# Patient Record
Sex: Female | Born: 1940 | Race: White | Hispanic: No | Marital: Married | Smoking: Never smoker
Health system: Southern US, Community
[De-identification: ages and names within clinical notes are randomized; demographics above are authoritative.]

## PROBLEM LIST (undated history)

## (undated) DIAGNOSIS — E079 Disorder of thyroid, unspecified: Secondary | ICD-10-CM

## (undated) DIAGNOSIS — I10 Essential (primary) hypertension: Secondary | ICD-10-CM

## (undated) HISTORY — PX: REPLACEMENT TOTAL KNEE: SUR1224

## (undated) HISTORY — PX: TOTAL SHOULDER REPLACEMENT: SUR1217

---

## 2017-06-08 ENCOUNTER — Inpatient Hospital Stay (HOSPITAL_BASED_OUTPATIENT_CLINIC_OR_DEPARTMENT_OTHER)
Admission: EM | Admit: 2017-06-08 | Discharge: 2017-06-11 | DRG: 481 | Disposition: A | Discharge status: Home Health | Payer: Medicare Other | Attending: Internal Medicine | Admitting: Internal Medicine

## 2017-06-08 ENCOUNTER — Emergency Department (HOSPITAL_BASED_OUTPATIENT_CLINIC_OR_DEPARTMENT_OTHER): Payer: Medicare Other

## 2017-06-08 ENCOUNTER — Encounter (HOSPITAL_BASED_OUTPATIENT_CLINIC_OR_DEPARTMENT_OTHER): Payer: Self-pay | Admitting: *Deleted

## 2017-06-08 DIAGNOSIS — Z880 Allergy status to penicillin: Secondary | ICD-10-CM

## 2017-06-08 DIAGNOSIS — Y9301 Activity, walking, marching and hiking: Secondary | ICD-10-CM | POA: Diagnosis present

## 2017-06-08 DIAGNOSIS — E039 Hypothyroidism, unspecified: Secondary | ICD-10-CM | POA: Diagnosis present

## 2017-06-08 DIAGNOSIS — S72331A Displaced oblique fracture of shaft of right femur, initial encounter for closed fracture: Secondary | ICD-10-CM | POA: Diagnosis not present

## 2017-06-08 DIAGNOSIS — M9711XA Periprosthetic fracture around internal prosthetic right knee joint, initial encounter: Secondary | ICD-10-CM | POA: Diagnosis present

## 2017-06-08 DIAGNOSIS — E785 Hyperlipidemia, unspecified: Secondary | ICD-10-CM | POA: Diagnosis present

## 2017-06-08 DIAGNOSIS — Z09 Encounter for follow-up examination after completed treatment for conditions other than malignant neoplasm: Secondary | ICD-10-CM

## 2017-06-08 DIAGNOSIS — S72301A Unspecified fracture of shaft of right femur, initial encounter for closed fracture: Secondary | ICD-10-CM | POA: Diagnosis present

## 2017-06-08 DIAGNOSIS — N390 Urinary tract infection, site not specified: Secondary | ICD-10-CM | POA: Diagnosis present

## 2017-06-08 DIAGNOSIS — M25551 Pain in right hip: Secondary | ICD-10-CM | POA: Diagnosis present

## 2017-06-08 DIAGNOSIS — S72441A Displaced fracture of lower epiphysis (separation) of right femur, initial encounter for closed fracture: Secondary | ICD-10-CM

## 2017-06-08 DIAGNOSIS — W19XXXA Unspecified fall, initial encounter: Secondary | ICD-10-CM

## 2017-06-08 DIAGNOSIS — I10 Essential (primary) hypertension: Secondary | ICD-10-CM | POA: Diagnosis present

## 2017-06-08 DIAGNOSIS — Z96612 Presence of left artificial shoulder joint: Secondary | ICD-10-CM | POA: Diagnosis present

## 2017-06-08 DIAGNOSIS — W109XXA Fall (on) (from) unspecified stairs and steps, initial encounter: Secondary | ICD-10-CM | POA: Diagnosis present

## 2017-06-08 DIAGNOSIS — S72409A Unspecified fracture of lower end of unspecified femur, initial encounter for closed fracture: Secondary | ICD-10-CM | POA: Diagnosis present

## 2017-06-08 HISTORY — DX: Essential (primary) hypertension: I10

## 2017-06-08 HISTORY — DX: Disorder of thyroid, unspecified: E07.9

## 2017-06-08 LAB — CBC WITH DIFFERENTIAL/PLATELET
BASOS ABS: 0 10*3/uL (ref 0.0–0.1)
BASOS PCT: 0 %
EOS PCT: 2 %
Eosinophils Absolute: 0.2 10*3/uL (ref 0.0–0.7)
HCT: 39.1 % (ref 36.0–46.0)
Hemoglobin: 12.7 g/dL (ref 12.0–15.0)
LYMPHS ABS: 1.2 10*3/uL (ref 0.7–4.0)
Lymphocytes Relative: 17 %
MCH: 30 pg (ref 26.0–34.0)
MCHC: 32.5 g/dL (ref 30.0–36.0)
MCV: 92.2 fL (ref 78.0–100.0)
MONO ABS: 0.9 10*3/uL (ref 0.1–1.0)
MONOS PCT: 12 %
Neutro Abs: 5 10*3/uL (ref 1.7–7.7)
Neutrophils Relative %: 69 %
PLATELETS: 213 10*3/uL (ref 150–400)
RBC: 4.24 MIL/uL (ref 3.87–5.11)
RDW: 14 % (ref 11.5–15.5)
WBC: 7.3 10*3/uL (ref 4.0–10.5)

## 2017-06-08 LAB — URINALYSIS, MICROSCOPIC (REFLEX)

## 2017-06-08 LAB — BASIC METABOLIC PANEL
Anion gap: 9 (ref 5–15)
BUN: 21 mg/dL — AB (ref 6–20)
CHLORIDE: 105 mmol/L (ref 101–111)
CO2: 26 mmol/L (ref 22–32)
CREATININE: 0.91 mg/dL (ref 0.44–1.00)
Calcium: 9 mg/dL (ref 8.9–10.3)
GFR calc Af Amer: >60 mL/min (ref >60)
GFR calc non Af Amer: 59 mL/min — ABNORMAL LOW (ref >60)
Glucose, Bld: 137 mg/dL — ABNORMAL HIGH (ref 65–99)
Potassium: 4.4 mmol/L (ref 3.5–5.1)
SODIUM: 140 mmol/L (ref 135–145)

## 2017-06-08 LAB — URINALYSIS, ROUTINE W REFLEX MICROSCOPIC
BILIRUBIN URINE: NEGATIVE
Glucose, UA: NEGATIVE mg/dL
HGB URINE DIPSTICK: NEGATIVE
KETONES UR: NEGATIVE mg/dL
NITRITE: NEGATIVE
PROTEIN: NEGATIVE mg/dL
Specific Gravity, Urine: 1.01 (ref 1.005–1.030)
pH: 7.5 (ref 5.0–8.0)

## 2017-06-08 LAB — PROTIME-INR
INR: 0.89
PROTHROMBIN TIME: 12 s (ref 11.4–15.2)

## 2017-06-08 MED ORDER — ONDANSETRON HCL 4 MG/2ML IJ SOLN
4.0000 mg | Freq: Once | INTRAMUSCULAR | Status: AC
  Administered 2017-06-08: 4 mg via INTRAVENOUS
  Filled 2017-06-08: qty 2

## 2017-06-08 MED ORDER — MORPHINE SULFATE (PF) 4 MG/ML IV SOLN
4.0000 mg | Freq: Once | INTRAVENOUS | Status: AC
  Administered 2017-06-08: 4 mg via INTRAVENOUS
  Filled 2017-06-08: qty 1

## 2017-06-08 NOTE — ED Provider Notes (Signed)
Emergency Department Provider Note   I have reviewed the triage vital signs and the nursing notes.   HISTORY  Chief Complaint Fall   HPI Melinda Montgomery is a 76 y.o. female with PMH of HTN and s/p right knee replacement presents to the emergency room in for evaluation of right knee pain after mechanical fall. Patient states that she was walking down steps when she missed a step and fell onto her buttocks. Her right leg got caught underneath her body when she felt sudden severe pain in the leg. She is unable to ambulate afterwards. Denies any numbness or tingling in the foot or leg. No head injury. No loss of consciousness. No confusion, nausea, vomiting. Patient is not on blood thinners. Denies hip pain. No radiation of symptoms. Pain is severe and worse with movement.    Past Medical History:  Diagnosis Date  . Hypertension   . Thyroid disease     Patient Active Problem List   Diagnosis Date Noted  . Essential hypertension 06/09/2017  . Hypothyroidism 06/09/2017  . Closed displaced fracture of lower epiphysis of right femur (HCC)   . Acute lower UTI   . Fall   . Fracture of distal femur (HCC) 06/08/2017    Past Surgical History:  Procedure Laterality Date  . REPLACEMENT TOTAL KNEE Right   . TOTAL SHOULDER REPLACEMENT Left       Allergies Asa [aspirin] and Penicillins  History reviewed. No pertinent family history.  Social History Social History  Substance Use Topics  . Smoking status: Never Smoker  . Smokeless tobacco: Never Used  . Alcohol use No    Review of Systems  Constitutional: No fever/chills Eyes: No visual changes. ENT: No sore throat. Cardiovascular: Denies chest pain. Respiratory: Denies shortness of breath. Gastrointestinal: No abdominal pain.  No nausea, no vomiting.  No diarrhea.  No constipation. Genitourinary: Negative for dysuria. Musculoskeletal: Negative for back pain. Positive right knee pain.  Skin: Negative for  rash. Neurological: Negative for headaches, focal weakness or numbness.  10-point ROS otherwise negative.  ____________________________________________   PHYSICAL EXAM:  VITAL SIGNS: ED Triage Vitals  Enc Vitals Group     BP 06/08/17 2049 135/67     Pulse Rate 06/08/17 2049 78     Resp 06/08/17 2049 19     Temp 06/08/17 2049 98.1 F (36.7 C)     Temp Source 06/08/17 2049 Oral     SpO2 06/08/17 2049 100 %     Weight 06/08/17 2049 160 lb (72.6 kg)     Height 06/08/17 2049 5\' 3"  (1.6 m)     Pain Score 06/08/17 2042 5   Constitutional: Alert and oriented. Well appearing and in no acute distress. Eyes: Conjunctivae are normal. Head: Atraumatic. Nose: No congestion/rhinnorhea. Mouth/Throat: Mucous membranes are moist. Neck: No stridor. No cervical spine tenderness to palpation. Cardiovascular: Normal rate, regular rhythm. Good peripheral circulation. Grossly normal heart sounds.   Respiratory: Normal respiratory effort.  No retractions. Lungs CTAB. Gastrointestinal: Soft and nontender. No distention.  Musculoskeletal: Edema of right distal thigh with tenderness to palpation. Right foot is neurovascularly intact with palpable DP and PT pulses.  Neurologic:  Normal speech and language. No gross focal neurologic deficits are appreciated.  Skin:  Skin is warm, dry and intact. No rash noted.   ____________________________________________   LABS (all labs ordered are listed, but only abnormal results are displayed)  Labs Reviewed  BASIC METABOLIC PANEL - Abnormal; Notable for the following:  Result Value   Glucose, Bld 137 (*)    BUN 21 (*)    GFR calc non Af Amer 59 (*)    All other components within normal limits  URINALYSIS, ROUTINE W REFLEX MICROSCOPIC - Abnormal; Notable for the following:    Leukocytes, UA MODERATE (*)    All other components within normal limits  URINALYSIS, MICROSCOPIC (REFLEX) - Abnormal; Notable for the following:    Bacteria, UA MANY (*)     Squamous Epithelial / LPF 0-5 (*)    All other components within normal limits  SURGICAL PCR SCREEN  URINE CULTURE  CBC WITH DIFFERENTIAL/PLATELET  PROTIME-INR  TYPE AND SCREEN  ABO/RH   ____________________________________________  EKG   EKG Interpretation  Date/Time:  Saturday June 08 2017 22:17:09 EDT Ventricular Rate:  85 PR Interval:    QRS Duration: 94 QT Interval:  372 QTC Calculation: 443 R Axis:   43 Text Interpretation:  Sinus rhythm Low voltage, precordial leads No STEMI.  Confirmed by Alona Bene [phone]) on 06/08/2017 10:21:32 PM Also confirmed by Alona Bene [phone]), editor Misty Stanley [phone])  on 06/09/2017 7:42:33 AM       ____________________________________________  RADIOLOGY  Dg Chest Portable 1 View  Result Date: 06/08/2017 CLINICAL DATA:  Larey Seat this evening, femur fracture. EXAM: PORTABLE CHEST 1 VIEW COMPARISON:  None. FINDINGS: Cardiomediastinal silhouette is normal. Mild bronchitic changes without pleural effusion or focal consolidation. No pneumothorax. LEFT shoulder arthroplasty. Soft tissue planes are nonsuspicious. IMPRESSION: Mild bronchitic changes. Electronically Signed   By: Awilda Metro M.D.   On: 06/08/2017 22:45   Dg Knee Complete 4 Views Right  Result Date: 06/08/2017 CLINICAL DATA:  Larey Seat on right knee EXAM: RIGHT KNEE - COMPLETE 4+ VIEW COMPARISON:  None. FINDINGS: Status post right knee replacement. Acute fracture of the distal femur above the prosthesis. This demonstrates varus angulation of the distal femoral fracture fragment and lower leg. There is about 1/2 shaft diameter of medial and posterior displacement of the distal fracture fragments. IMPRESSION: Status post right knee replacement with acute displaced and slightly angulated distal femoral fracture about a cm above the femoral compliment of the knee replacement Electronically Signed   By: Jasmine Pang M.D.   On: 06/08/2017 21:31     ____________________________________________   PROCEDURES  Procedure(s) performed:   .Splint Application Date/Time: 06/09/2017 11:21 AM Performed by: Maia Plan Authorized by: Maia Plan   Consent:    Consent obtained:  Verbal   Consent given by:  Patient   Risks discussed:  Discoloration, numbness, pain and swelling   Alternatives discussed:  No treatment Pre-procedure details:    Sensation:  Normal   Skin color:  Normal Procedure details:    Laterality:  Right   Location:  Leg   Leg:  R upper leg   Splint type:  Knee immobilizer Post-procedure details:    Pain:  Unchanged   Sensation:  Normal   Skin color:  Normal    Patient tolerance of procedure:  Tolerated well, no immediate complications    ____________________________________________   INITIAL IMPRESSION / ASSESSMENT AND PLAN / ED COURSE  Pertinent labs & imaging results that were available during my care of the patient were reviewed by me and considered in my medical decision making (see chart for details).  Patient presents to the emergency department for evaluation of right knee pain after mechanical fall. She has an acute displaced, slightly angulated fracture of the distal femur just above her knee replacement. Plan for  pain meds, labs, orthopedic discussion.  10:00 PM Spoke with Dr. Ophelia Charter with orthopedics. Will try for surgery tomorrow so keep NPO. Apply knee immobilizer. Will discuss admission at [facility] with Hospitalist.   10:30 PM Discussed patient's case with Hospitalist, Dr. Antionette Char.  Recommend admission. Patient and family (if present) updated with plan. Care transferred to Hospitalist service.  I reviewed all nursing notes, vitals, pertinent old records, EKGs, labs, imaging (as available).  ____________________________________________  FINAL CLINICAL IMPRESSION(S) / ED DIAGNOSES  Final diagnoses:  Fall, initial encounter  Closed displaced fracture of distal epiphysis of right  femur, initial encounter (HCC)     MEDICATIONS GIVEN DURING THIS VISIT:  Medications  amLODipine (NORVASC) tablet 5 mg ( Oral Automatically Held 06/17/17 1000)  atorvastatin (LIPITOR) tablet 20 mg ( Oral Automatically Held 06/17/17 1000)  cholecalciferol (VITAMIN D) tablet 400 Units ( Oral Automatically Held 06/17/17 1000)  propranolol (INDERAL) tablet 60 mg ( Oral Automatically Held 06/17/17 2200)  darifenacin (ENABLEX) 24 hr tablet 15 mg ( Oral Automatically Held 06/17/17 1000)  vitamin C (ASCORBIC ACID) tablet 500 mg ( Oral Automatically Held 06/17/17 1000)  HYDROcodone-acetaminophen (NORCO/VICODIN) 5-325 MG per tablet 1-2 tablet ( Oral MAR Hold 06/09/17 1117)  morphine 2 MG/ML injection 0.5 mg ( Intravenous MAR Hold 06/09/17 1117)  0.9 %  sodium chloride infusion ( Intravenous New Bag/Given 06/09/17 0213)  senna-docusate (Senokot-S) tablet 1 tablet ( Oral MAR Hold 06/09/17 1117)  bisacodyl (DULCOLAX) EC tablet 5 mg ( Oral MAR Hold 06/09/17 1117)  hydrALAZINE (APRESOLINE) injection 10 mg ( Intravenous MAR Hold 06/09/17 1117)  ciprofloxacin (CIPRO) IVPB 400 mg ( Intravenous Automatically Held 06/17/17 2200)  levothyroxine (SYNTHROID, LEVOTHROID) tablet 150 mcg ( Oral Automatically Held 06/17/17 0800)  chlorhexidine (HIBICLENS) 4 % liquid 4 application (not administered)  povidone-iodine 10 % swab 2 application (not administered)  ceFAZolin (ANCEF) IVPB 2g/100 mL premix (not administered)  morphine 4 MG/ML injection 4 mg (4 mg Intravenous Given 06/08/17 2221)  ondansetron (ZOFRAN) injection 4 mg (4 mg Intravenous Given 06/08/17 2219)     NEW OUTPATIENT MEDICATIONS STARTED DURING THIS VISIT:  None   Note:  This document was prepared using Dragon voice recognition software and may include unintentional dictation errors.  Alona Bene, MD Emergency Medicine    Long, Arlyss Repress, MD 06/09/17 [id number]

## 2017-06-08 NOTE — ED Triage Notes (Signed)
Fall from bottom stair. Denies hitting head, no LOC.  Denies blood thinner. Hx of (R) knee replacement 2 years ago. Reports right knee pain, no obvious deformity.  VSS.  Unable to bear weight on right leg.  CMS intact.

## 2017-06-08 NOTE — ED Notes (Signed)
ED Provider at bedside. 

## 2017-06-08 NOTE — ED Notes (Signed)
Page returned by Dr. Antionette Char, confirmed pt is non-monitored med/surg status, report given to primary RN Reita Cliche. Confirms bed ready. [facility] here for transport at [facility].

## 2017-06-08 NOTE — ED Notes (Signed)
Patient transported to X-ray 

## 2017-06-09 ENCOUNTER — Encounter (HOSPITAL_COMMUNITY)
Admission: EM | Disposition: A | Discharge status: Home Health | Payer: Self-pay | Source: Home / Self Care | Attending: Internal Medicine

## 2017-06-09 ENCOUNTER — Encounter (HOSPITAL_COMMUNITY): Payer: Self-pay | Admitting: Family Medicine

## 2017-06-09 ENCOUNTER — Inpatient Hospital Stay (HOSPITAL_COMMUNITY): Payer: Medicare Other | Admitting: Certified Registered Nurse Anesthetist

## 2017-06-09 ENCOUNTER — Inpatient Hospital Stay (HOSPITAL_COMMUNITY): Payer: Medicare Other

## 2017-06-09 DIAGNOSIS — I10 Essential (primary) hypertension: Secondary | ICD-10-CM | POA: Diagnosis present

## 2017-06-09 DIAGNOSIS — S72441A Displaced fracture of lower epiphysis (separation) of right femur, initial encounter for closed fracture: Secondary | ICD-10-CM

## 2017-06-09 DIAGNOSIS — W19XXXA Unspecified fall, initial encounter: Secondary | ICD-10-CM

## 2017-06-09 DIAGNOSIS — N390 Urinary tract infection, site not specified: Secondary | ICD-10-CM

## 2017-06-09 DIAGNOSIS — S72331A Displaced oblique fracture of shaft of right femur, initial encounter for closed fracture: Secondary | ICD-10-CM

## 2017-06-09 DIAGNOSIS — E039 Hypothyroidism, unspecified: Secondary | ICD-10-CM | POA: Diagnosis present

## 2017-06-09 HISTORY — PX: FEMUR IM NAIL: SHX1597

## 2017-06-09 LAB — TYPE AND SCREEN
ABO/RH(D): A POS
Antibody Screen: NEGATIVE

## 2017-06-09 LAB — CBC
HCT: 38.7 % (ref 36.0–46.0)
Hemoglobin: 12.7 g/dL (ref 12.0–15.0)
MCH: 30.2 pg (ref 26.0–34.0)
MCHC: 32.8 g/dL (ref 30.0–36.0)
MCV: 92.1 fL (ref 78.0–100.0)
PLATELETS: 207 10*3/uL (ref 150–400)
RBC: 4.2 MIL/uL (ref 3.87–5.11)
RDW: 14.1 % (ref 11.5–15.5)
WBC: 13.8 10*3/uL — AB (ref 4.0–10.5)

## 2017-06-09 LAB — CREATININE, SERUM
Creatinine, Ser: 0.85 mg/dL (ref 0.44–1.00)
GFR calc non Af Amer: >60 mL/min (ref >60)

## 2017-06-09 LAB — SURGICAL PCR SCREEN
MRSA, PCR: NEGATIVE
STAPHYLOCOCCUS AUREUS: NEGATIVE

## 2017-06-09 LAB — ABO/RH: ABO/RH(D): A POS

## 2017-06-09 SURGERY — INSERTION, INTRAMEDULLARY ROD, FEMUR, RETROGRADE
Anesthesia: Spinal | Site: Knee | Laterality: Right

## 2017-06-09 MED ORDER — AMLODIPINE-OLMESARTAN 5-20 MG PO TABS: 1.0000 | ORAL_TABLET | Freq: Every day | ORAL | Status: DC

## 2017-06-09 MED ORDER — ATORVASTATIN CALCIUM 20 MG PO TABS
20.0000 mg | ORAL_TABLET | Freq: Every day | ORAL | Status: DC
  Administered 2017-06-10 – 2017-06-11 (×2): 20 mg via ORAL
  Filled 2017-06-09 (×3): qty 1

## 2017-06-09 MED ORDER — 0.9 % SODIUM CHLORIDE (POUR BTL) OPTIME
TOPICAL | Status: DC | PRN
  Administered 2017-06-09: 1000 mL

## 2017-06-09 MED ORDER — METOCLOPRAMIDE HCL 5 MG/ML IJ SOLN: 10.0000 mg | Freq: Once | INTRAMUSCULAR | Status: DC | PRN

## 2017-06-09 MED ORDER — ACETAMINOPHEN 650 MG RE SUPP: 650.0000 mg | Freq: Four times a day (QID) | RECTAL | Status: DC | PRN

## 2017-06-09 MED ORDER — PHENYLEPHRINE HCL 10 MG/ML IJ SOLN
INTRAVENOUS | Status: DC | PRN
  Administered 2017-06-09: 20 ug/min via INTRAVENOUS

## 2017-06-09 MED ORDER — SODIUM CHLORIDE 0.9 % IV SOLN
INTRAVENOUS | Status: DC
  Administered 2017-06-09 – 2017-06-10 (×2): via INTRAVENOUS

## 2017-06-09 MED ORDER — LIDOCAINE 2% (20 MG/ML) 5 ML SYRINGE
INTRAMUSCULAR | Status: AC
Start: 2017-06-09 — End: ?
  Filled 2017-06-09: qty 5

## 2017-06-09 MED ORDER — ONDANSETRON HCL 4 MG/2ML IJ SOLN
INTRAMUSCULAR | Status: AC
  Filled 2017-06-09: qty 2

## 2017-06-09 MED ORDER — MENTHOL 3 MG MT LOZG: 1.0000 | LOZENGE | OROMUCOSAL | Status: DC | PRN

## 2017-06-09 MED ORDER — HYDROCODONE-ACETAMINOPHEN 5-325 MG PO TABS
1.0000 | ORAL_TABLET | Freq: Four times a day (QID) | ORAL | Status: DC | PRN
  Administered 2017-06-09: 02:00:00 1 via ORAL
  Filled 2017-06-09: qty 1

## 2017-06-09 MED ORDER — ONDANSETRON HCL 4 MG/2ML IJ SOLN: 4.0000 mg | Freq: Four times a day (QID) | INTRAMUSCULAR | Status: DC | PRN

## 2017-06-09 MED ORDER — DARIFENACIN HYDROBROMIDE ER 7.5 MG PO TB24
7.5000 mg | ORAL_TABLET | Freq: Every day | ORAL | Status: DC
  Administered 2017-06-10 – 2017-06-11 (×2): 7.5 mg via ORAL
  Filled 2017-06-09 (×2): qty 1

## 2017-06-09 MED ORDER — FENTANYL CITRATE (PF) 100 MCG/2ML IJ SOLN
INTRAMUSCULAR | Status: DC | PRN
  Administered 2017-06-09: 25 ug via INTRAVENOUS

## 2017-06-09 MED ORDER — BUPIVACAINE HCL (PF) 0.5 % IJ SOLN
INTRAMUSCULAR | Status: AC
  Filled 2017-06-09: qty 30

## 2017-06-09 MED ORDER — MEPERIDINE HCL 50 MG/ML IJ SOLN: 6.2500 mg | INTRAMUSCULAR | Status: DC | PRN

## 2017-06-09 MED ORDER — NETARSUDIL DIMESYLATE 0.02 % OP SOLN
1.0000 [drp] | Freq: Every day | OPHTHALMIC | Status: DC
  Administered 2017-06-09 – 2017-06-10 (×2): 1 [drp] via OPHTHALMIC

## 2017-06-09 MED ORDER — PANTOPRAZOLE SODIUM 40 MG PO TBEC
40.0000 mg | DELAYED_RELEASE_TABLET | Freq: Every day | ORAL | Status: DC
Start: 2017-06-09 — End: 2017-06-11
  Administered 2017-06-09 – 2017-06-11 (×3): 40 mg via ORAL
  Filled 2017-06-09 (×3): qty 1

## 2017-06-09 MED ORDER — LEVOTHYROXINE SODIUM 75 MCG PO TABS
150.0000 ug | ORAL_TABLET | Freq: Every day | ORAL | Status: DC
  Administered 2017-06-09 – 2017-06-11 (×3): 150 ug via ORAL
  Filled 2017-06-09 (×3): qty 2

## 2017-06-09 MED ORDER — MORPHINE SULFATE (PF) 2 MG/ML IV SOLN
0.5000 mg | INTRAVENOUS | Status: DC | PRN
Start: 2017-06-09 — End: 2017-06-11

## 2017-06-09 MED ORDER — ENOXAPARIN SODIUM 40 MG/0.4ML ~~LOC~~ SOLN
40.0000 mg | SUBCUTANEOUS | Status: DC
  Administered 2017-06-10: 08:00:00 40 mg via SUBCUTANEOUS
  Filled 2017-06-09: qty 0.4

## 2017-06-09 MED ORDER — ONDANSETRON HCL 4 MG/2ML IJ SOLN
INTRAMUSCULAR | Status: DC | PRN
  Administered 2017-06-09: 4 mg via INTRAVENOUS

## 2017-06-09 MED ORDER — DEXAMETHASONE SODIUM PHOSPHATE 10 MG/ML IJ SOLN
INTRAMUSCULAR | Status: DC | PRN
  Administered 2017-06-09: 10 mg via INTRAVENOUS

## 2017-06-09 MED ORDER — FENTANYL CITRATE (PF) 100 MCG/2ML IJ SOLN
INTRAMUSCULAR | Status: AC
  Filled 2017-06-09: qty 2

## 2017-06-09 MED ORDER — HYDRALAZINE HCL 20 MG/ML IJ SOLN
10.0000 mg | INTRAMUSCULAR | Status: DC | PRN
  Filled 2017-06-09: qty 0.5

## 2017-06-09 MED ORDER — CEFAZOLIN SODIUM-DEXTROSE 2-3 GM-% IV SOLR
INTRAVENOUS | Status: DC | PRN
  Administered 2017-06-09: 2 g via INTRAVENOUS

## 2017-06-09 MED ORDER — SENNOSIDES-DOCUSATE SODIUM 8.6-50 MG PO TABS: 1.0000 | ORAL_TABLET | Freq: Every evening | ORAL | Status: DC | PRN

## 2017-06-09 MED ORDER — VITAMIN C 500 MG PO TABS
500.0000 mg | ORAL_TABLET | Freq: Every day | ORAL | Status: DC
  Administered 2017-06-10 – 2017-06-11 (×2): 500 mg via ORAL
  Filled 2017-06-09 (×2): qty 1

## 2017-06-09 MED ORDER — BUPIVACAINE HCL (PF) 0.5 % IJ SOLN
INTRAMUSCULAR | Status: DC | PRN
  Administered 2017-06-09: 3 mL

## 2017-06-09 MED ORDER — CIPROFLOXACIN IN D5W 400 MG/200ML IV SOLN
400.0000 mg | Freq: Two times a day (BID) | INTRAVENOUS | Status: DC
  Administered 2017-06-09 – 2017-06-10 (×4): 400 mg via INTRAVENOUS
  Filled 2017-06-09 (×5): qty 200

## 2017-06-09 MED ORDER — MIDAZOLAM HCL 2 MG/2ML IJ SOLN
INTRAMUSCULAR | Status: AC
  Filled 2017-06-09: qty 2

## 2017-06-09 MED ORDER — IRBESARTAN 150 MG PO TABS
150.0000 mg | ORAL_TABLET | Freq: Every day | ORAL | Status: DC
  Filled 2017-06-09: qty 1

## 2017-06-09 MED ORDER — LEVOTHYROXINE SODIUM 100 MCG PO TABS: 100.0000 ug | ORAL_TABLET | Freq: Every day | ORAL | Status: DC

## 2017-06-09 MED ORDER — PROPOFOL 10 MG/ML IV BOLUS
INTRAVENOUS | Status: AC
  Filled 2017-06-09: qty 20

## 2017-06-09 MED ORDER — SODIUM CHLORIDE 0.9 % IV SOLN
INTRAVENOUS | Status: AC
  Administered 2017-06-09: 02:00:00 via INTRAVENOUS

## 2017-06-09 MED ORDER — PROPRANOLOL HCL 60 MG PO TABS
60.0000 mg | ORAL_TABLET | Freq: Three times a day (TID) | ORAL | Status: DC
  Administered 2017-06-09: 11:00:00 60 mg via ORAL
  Filled 2017-06-09 (×2): qty 1

## 2017-06-09 MED ORDER — STERILE WATER FOR IRRIGATION IR SOLN
Status: DC | PRN
  Administered 2017-06-09: 1000 mL

## 2017-06-09 MED ORDER — TIMOLOL MALEATE 0.5 % OP SOLG
1.0000 [drp] | Freq: Every day | OPHTHALMIC | Status: DC
  Administered 2017-06-10 – 2017-06-11 (×2): 1 [drp] via OPHTHALMIC
  Filled 2017-06-09: qty 5

## 2017-06-09 MED ORDER — PHENYLEPHRINE HCL 10 MG/ML IJ SOLN
INTRAMUSCULAR | Status: DC | PRN
  Administered 2017-06-09: 40 ug via INTRAVENOUS
  Administered 2017-06-09: 80 ug via INTRAVENOUS

## 2017-06-09 MED ORDER — MIDAZOLAM HCL 5 MG/5ML IJ SOLN
INTRAMUSCULAR | Status: DC | PRN
  Administered 2017-06-09: 0.5 mg via INTRAVENOUS
  Administered 2017-06-09: 1 mg via INTRAVENOUS

## 2017-06-09 MED ORDER — HYDROMORPHONE HCL-NACL 0.5-0.9 MG/ML-% IV SOSY: 0.2500 mg | PREFILLED_SYRINGE | INTRAVENOUS | Status: DC | PRN

## 2017-06-09 MED ORDER — BISACODYL 5 MG PO TBEC: 5.0000 mg | DELAYED_RELEASE_TABLET | Freq: Every day | ORAL | Status: DC | PRN

## 2017-06-09 MED ORDER — POVIDONE-IODINE 10 % EX SWAB: 2.0000 "application " | Freq: Once | CUTANEOUS | Status: DC

## 2017-06-09 MED ORDER — DEXAMETHASONE SODIUM PHOSPHATE 10 MG/ML IJ SOLN
INTRAMUSCULAR | Status: AC
  Filled 2017-06-09: qty 1

## 2017-06-09 MED ORDER — AMLODIPINE BESYLATE 5 MG PO TABS
5.0000 mg | ORAL_TABLET | Freq: Every day | ORAL | Status: DC
  Filled 2017-06-09: qty 1

## 2017-06-09 MED ORDER — CEFAZOLIN SODIUM-DEXTROSE 2-4 GM/100ML-% IV SOLN
INTRAVENOUS | Status: AC
  Filled 2017-06-09: qty 100

## 2017-06-09 MED ORDER — METOCLOPRAMIDE HCL 5 MG/ML IJ SOLN: 5.0000 mg | Freq: Three times a day (TID) | INTRAMUSCULAR | Status: DC | PRN

## 2017-06-09 MED ORDER — ONDANSETRON HCL 4 MG PO TABS: 4.0000 mg | ORAL_TABLET | Freq: Four times a day (QID) | ORAL | Status: DC | PRN

## 2017-06-09 MED ORDER — LACTATED RINGERS IV SOLN
INTRAVENOUS | Status: DC | PRN
  Administered 2017-06-09 (×2): via INTRAVENOUS

## 2017-06-09 MED ORDER — PROPOFOL 500 MG/50ML IV EMUL
INTRAVENOUS | Status: DC | PRN
  Administered 2017-06-09: 50 ug/kg/min via INTRAVENOUS

## 2017-06-09 MED ORDER — CEFAZOLIN SODIUM-DEXTROSE 2-4 GM/100ML-% IV SOLN: 2.0000 g | INTRAVENOUS | Status: DC

## 2017-06-09 MED ORDER — LIDOCAINE HCL (CARDIAC) 20 MG/ML IV SOLN
INTRAVENOUS | Status: DC | PRN
  Administered 2017-06-09: 50 mg via INTRAVENOUS

## 2017-06-09 MED ORDER — PROPOFOL 10 MG/ML IV BOLUS
INTRAVENOUS | Status: DC | PRN
  Administered 2017-06-09: 10 mg via INTRAVENOUS
  Administered 2017-06-09: 30 mg via INTRAVENOUS

## 2017-06-09 MED ORDER — CHOLECALCIFEROL 10 MCG (400 UNIT) PO TABS
400.0000 [IU] | ORAL_TABLET | Freq: Every day | ORAL | Status: DC
  Administered 2017-06-10 – 2017-06-11 (×2): 400 [IU] via ORAL
  Filled 2017-06-09 (×3): qty 1

## 2017-06-09 MED ORDER — PHENOL 1.4 % MT LIQD: 1.0000 | OROMUCOSAL | Status: DC | PRN

## 2017-06-09 MED ORDER — PROPOFOL 10 MG/ML IV BOLUS
INTRAVENOUS | Status: AC
  Filled 2017-06-09: qty 40

## 2017-06-09 MED ORDER — MORPHINE SULFATE (PF) 2 MG/ML IV SOLN: 0.5000 mg | INTRAVENOUS | Status: DC | PRN

## 2017-06-09 MED ORDER — AMLODIPINE BESYLATE 5 MG PO TABS: 5.0000 mg | ORAL_TABLET | Freq: Every day | ORAL | Status: DC

## 2017-06-09 MED ORDER — HYDROCODONE-ACETAMINOPHEN 5-325 MG PO TABS
1.0000 | ORAL_TABLET | Freq: Four times a day (QID) | ORAL | Status: DC | PRN
  Administered 2017-06-09: 1 via ORAL
  Administered 2017-06-10 (×2): 2 via ORAL
  Filled 2017-06-09 (×2): qty 2
  Filled 2017-06-09: qty 1
  Filled 2017-06-09: qty 2

## 2017-06-09 MED ORDER — CHLORHEXIDINE GLUCONATE 4 % EX LIQD: 60.0000 mL | Freq: Once | CUTANEOUS | Status: DC

## 2017-06-09 MED ORDER — ACETAMINOPHEN 325 MG PO TABS: 650.0000 mg | ORAL_TABLET | Freq: Four times a day (QID) | ORAL | Status: DC | PRN

## 2017-06-09 MED ORDER — METOCLOPRAMIDE HCL 5 MG PO TABS: 5.0000 mg | ORAL_TABLET | Freq: Three times a day (TID) | ORAL | Status: DC | PRN

## 2017-06-09 MED ORDER — DARIFENACIN HYDROBROMIDE ER 15 MG PO TB24
15.0000 mg | ORAL_TABLET | Freq: Every day | ORAL | Status: DC
  Filled 2017-06-09: qty 1

## 2017-06-09 MED ORDER — PROPRANOLOL HCL ER 60 MG PO CP24
60.0000 mg | ORAL_CAPSULE | Freq: Every day | ORAL | Status: DC
  Administered 2017-06-10 – 2017-06-11 (×2): 60 mg via ORAL
  Filled 2017-06-09 (×2): qty 1

## 2017-06-09 SURGICAL SUPPLY — 53 items
BAG ZIPLOCK 12X15 (MISCELLANEOUS) ×3 IMPLANT
BANDAGE ELASTIC 6 VELCRO ST LF (GAUZE/BANDAGES/DRESSINGS) ×3 IMPLANT
BIT DRILL CALIBRATED 4.3MMX365 (DRILL) ×1 IMPLANT
BIT DRILL CROWE PNT TWST 4.5MM (DRILL) ×2 IMPLANT
BLADE SURG 15 STRL LF DISP TIS (BLADE) ×1 IMPLANT
BLADE SURG 15 STRL SS (BLADE) ×2
COVER BACK TABLE 60X90IN (DRAPES) IMPLANT
COVER SURGICAL LIGHT HANDLE (MISCELLANEOUS) ×3 IMPLANT
DRAPE ORTHO SPLIT 77X108 STRL (DRAPES) ×4
DRAPE STERI IOBAN 125X83 (DRAPES) IMPLANT
DRAPE SURG ORHT 6 SPLT 77X108 (DRAPES) ×2 IMPLANT
DRILL CALIBRATED 4.3MMX365 (DRILL) ×3
DRILL CROWE POINT TWIST 4.5MM (DRILL) ×6
DRSG MEPILEX BORDER 4X12 (GAUZE/BANDAGES/DRESSINGS) ×3 IMPLANT
DRSG MEPILEX BORDER 4X4 (GAUZE/BANDAGES/DRESSINGS) ×6 IMPLANT
DRSG PAD ABDOMINAL 8X10 ST (GAUZE/BANDAGES/DRESSINGS) ×3 IMPLANT
DURAPREP 26ML APPLICATOR (WOUND CARE) ×3 IMPLANT
ELECT PENCIL ROCKER SW 15FT (MISCELLANEOUS) ×3 IMPLANT
ELECT REM PT RETURN 15FT ADLT (MISCELLANEOUS) ×3 IMPLANT
GAUZE SPONGE 4X4 12PLY STRL (GAUZE/BANDAGES/DRESSINGS) ×3 IMPLANT
GLOVE BIO SURGEON STRL SZ 6.5 (GLOVE) ×2 IMPLANT
GLOVE BIO SURGEONS STRL SZ 6.5 (GLOVE) ×1
GLOVE BIOGEL PI IND STRL 6.5 (GLOVE) ×1 IMPLANT
GLOVE BIOGEL PI IND STRL 7.0 (GLOVE) ×2 IMPLANT
GLOVE BIOGEL PI INDICATOR 6.5 (GLOVE) ×2
GLOVE BIOGEL PI INDICATOR 7.0 (GLOVE) ×4
GLOVE ORTHO TXT STRL SZ7.5 (GLOVE) ×3 IMPLANT
GOWN STRL REUS W/TWL LRG LVL3 (GOWN DISPOSABLE) ×6 IMPLANT
GOWN STRL REUS W/TWL XL LVL3 (GOWN DISPOSABLE) ×3 IMPLANT
GUIDEPIN 3.2X17.5 THRD DISP (PIN) ×3 IMPLANT
GUIDEWIRE BEAD TIP (WIRE) ×3 IMPLANT
IMMOBILIZER KNEE 20 (SOFTGOODS) ×3 IMPLANT
KIT BASIN OR (CUSTOM PROCEDURE TRAY) IMPLANT
NAIL FEM RETRO 12X300 (Nail) ×3 IMPLANT
PACK GENERAL/GYN (CUSTOM PROCEDURE TRAY) IMPLANT
PACK TOTAL JOINT (CUSTOM PROCEDURE TRAY) ×3 IMPLANT
POSITIONER SURGICAL ARM (MISCELLANEOUS) ×3 IMPLANT
REAMER ONE STEP 12.2MM (TRAUMA) ×3 IMPLANT
SCREW CORT TI DBL LEAD 5X34 (Screw) ×3 IMPLANT
SCREW CORT TI DBL LEAD 5X44 (Screw) ×3 IMPLANT
SCREW CORT TI DBL LEAD 5X48 (Screw) ×3 IMPLANT
SCREW CORT TI DBL LEAD 5X56 (Screw) ×3 IMPLANT
SCREW CORT TI DBL LEAD 5X70 (Screw) ×3 IMPLANT
SCREW CORT TI DBLE LEAD 5X54 (Screw) ×3 IMPLANT
SET PAD KNEE POSITIONER (MISCELLANEOUS) IMPLANT
STAPLER VISISTAT 35W (STAPLE) ×3 IMPLANT
SUT VIC AB 0 CT1 27 (SUTURE) ×4
SUT VIC AB 0 CT1 27XBRD ANTBC (SUTURE) ×2 IMPLANT
SUT VIC AB 1 CT1 36 (SUTURE) ×6 IMPLANT
SUT VIC AB 2-0 CT1 27 (SUTURE) ×4
SUT VIC AB 2-0 CT1 TAPERPNT 27 (SUTURE) ×2 IMPLANT
TOWEL OR 17X26 10 PK STRL BLUE (TOWEL DISPOSABLE) ×3 IMPLANT
TRAY FOLEY CATH SILVER 14FR (SET/KITS/TRAYS/PACK) IMPLANT

## 2017-06-09 NOTE — Anesthesia Procedure Notes (Addendum)
Spinal  Patient location during procedure: OR Start time: 06/09/2017 12:04 PM Staffing Anesthesiologist: Mal Amabile Preanesthetic Checklist Completed: patient identified, site marked, surgical consent, pre-op evaluation, timeout performed, IV checked, risks and benefits discussed and monitors and equipment checked Spinal Block Patient position: right lateral decubitus Prep: site prepped and draped and DuraPrep Patient monitoring: heart rate, cardiac monitor, continuous pulse ox and blood pressure Approach: midline Location: L4-5 Injection technique: single-shot Needle Needle type: Pencan  Needle gauge: 24 G Needle length: 9 cm Needle insertion depth: 5 cm Assessment Sensory level: T8 Additional Notes Attempt x 2 at L3-4 unsuccessful. Attempt x 1 at L4-5. CSF clear with free flow, no heme or paresthesias. LA injected. Spinal needle withdrawn and patient placed supine. Patient tolerated procedure well.

## 2017-06-09 NOTE — H&P (Signed)
History and Physical    [name] ZOX:[id number] DOB: 31-Jan-1941 DOA: 06/08/2017  PCP: System, Pcp Not In   Patient coming from: Home, by way of [facility]   Chief Complaint: Right knee pain after a fall   HPI: Melinda Montgomery is a 76 y.o. female with medical history significant for hypertension and hypothyroidism, now presenting to the emergency department for evaluation of severe right knee pain and inability to bear weight after mechanical fall. Patient is here from her home in [place], visiting a friend, and has been experiencing some urinary urgency and frequency, but has been generally well with no fevers or chills, no cough or dyspnea, and no chest pain or palpitations. She reports feeling well today when she tripped and fell onto her side and experienced immediate and severe pain at the right knee. She denies hitting her head or losing consciousness. She was unable to bear weight after this. EMS was called out for evaluation. She was transported to the hospital. Patient is able to attend to all of her ADLs and reports an active lifestyle, never experiencing chest pain or palpitations, and with ability to ascend a flight of stairs without any significant respiratory symptoms.  [facility] ED Course: Upon arrival to the [facility] ED, patient is found to be afebrile, saturating well on room air, and with vitals otherwise stable. EKG features a sinus rhythm with low-voltage QRS. Chest x-ray demonstrated mild bronchitic changes and radiographs of the right femur demonstrate acute displaced and slightly angulated distal femur fracture. Chemistry panels notable for mildly elevated BUN to creatinine ratio. CBC is unremarkable. Urinalysis is suggestive of possible infection. Patient was treated with morphine and Zofran in the emergency department. Orthopedic surgery was consulted by the ED physician and recommended a medical admission, indicating that they would plan to operate on the patient  on 06/09/2017. Patient remained medically stable and in no apparent respiratory distress and will be admitted to the medical-surgical unit at [facility] for ongoing evaluation and management of distal right femur fracture after mechanical fall.  Review of Systems:  All other systems reviewed and apart from HPI, are negative.  Past Medical History:  Diagnosis Date  . Hypertension   . Thyroid disease     Past Surgical History:  Procedure Laterality Date  . REPLACEMENT TOTAL KNEE Right   . TOTAL SHOULDER REPLACEMENT Left      reports that she has never smoked. She has never used smokeless tobacco. She reports that she does not drink alcohol or use drugs.  Allergies  Allergen Reactions  . Asa [Aspirin]   . Penicillins     History reviewed. No pertinent family history.   Prior to Admission medications   Medication Sig Start Date End Date Taking? Authorizing Provider  amLODipine-benazepril (LOTREL) 5-20 MG capsule Take 1 capsule by mouth daily.   Yes [provider]  atorvastatin (LIPITOR) 20 MG tablet Take 20 mg by mouth daily.   Yes [provider]  celecoxib (CELEBREX) 200 MG capsule Take 200 mg by mouth 2 (two) times daily.   Yes [provider]  cholecalciferol (VITAMIN D) 400 units TABS tablet Take 400 Units by mouth.   Yes [provider]  levothyroxine (SYNTHROID, LEVOTHROID) 150 MCG tablet Take 150 mcg by mouth daily before breakfast.   Yes [provider]  levothyroxine (SYNTHROID, LEVOTHROID) 50 MCG tablet Take 50 mcg by mouth daily before breakfast.   Yes [provider]  propranolol (INDERAL) 60 MG tablet Take  60 mg by mouth 3 (three) times daily.   Yes [provider]  trospium (SANCTURA) 20 MG tablet Take 60 mg by mouth 2 (two) times daily.   Yes [provider]  vitamin C (ASCORBIC ACID) 500 MG tablet Take 600 mg by mouth daily.   Yes [provider]    Physical Exam: Vitals:    06/08/17 2049 06/08/17 2302 06/09/17 0000  BP: 135/67 135/63 133/65  Pulse: 78 81 80  Resp: 19 15 16   Temp: 98.1 F (36.7 C)  98.8 F (37.1 C)  TempSrc: Oral  Oral  SpO2: 100% 96% 99%  Weight: 72.6 kg (160 lb)    Height: 5\' 3"  (1.6 m)        Constitutional: NAD, calm, in apparent discomfort Eyes: PERTLA, lids and conjunctivae normal ENMT: Mucous membranes are dry. Posterior pharynx clear of any exudate or lesions.   Neck: normal, supple, no masses, no thyromegaly Respiratory: clear to auscultation bilaterally, no wheezing, no crackles. Normal respiratory effort.   Cardiovascular: S1 & S2 heard, regular rate and rhythm. Trace pretibial edema. 2+ pedal pulses. No significant JVD. Abdomen: No distension, no tenderness, no masses palpated. Bowel sounds normal.  Musculoskeletal: no clubbing / cyanosis. Exquisitely tender about the right knee.  Skin: no significant rashes, lesions, ulcers. Warm, dry, well-perfused. Neurologic: CN 2-12 grossly intact. Sensation intact, DTR normal. Strength 5/5 in all 4 limbs.  Psychiatric: Alert and oriented x 3. Pleasant, cooperative.     Labs on Admission: I have personally reviewed following labs and imaging studies  CBC:  Recent Labs Lab 06/08/17 2145  WBC 7.3  NEUTROABS 5.0  HGB 12.7  HCT 39.1  MCV 92.2  PLT 213   Basic Metabolic Panel:  Recent Labs Lab 06/08/17 2145  NA 140  K 4.4  CL 105  CO2 26  GLUCOSE 137*  BUN 21*  CREATININE 0.91  CALCIUM 9.0   GFR: Estimated Creatinine Clearance: 49.4 mL/min (by C-G formula based on SCr of 0.91 mg/dL). Liver Function Tests: No results for input(s): AST, ALT, ALKPHOS, BILITOT, PROT, ALBUMIN in the last 168 hours. No results for input(s): LIPASE, AMYLASE in the last 168 hours. No results for input(s): AMMONIA in the last 168 hours. Coagulation Profile:  Recent Labs Lab 06/08/17 2145  INR 0.89   Cardiac Enzymes: No results for input(s): CKTOTAL, CKMB, CKMBINDEX, TROPONINI  in the last 168 hours. BNP (last 3 results) No results for input(s): PROBNP in the last 8760 hours. HbA1C: No results for input(s): HGBA1C in the last 72 hours. CBG: No results for input(s): GLUCAP in the last 168 hours. Lipid Profile: No results for input(s): CHOL, HDL, LDLCALC, TRIG, CHOLHDL, LDLDIRECT in the last 72 hours. Thyroid Function Tests: No results for input(s): TSH, T4TOTAL, FREET4, T3FREE, THYROIDAB in the last 72 hours. Anemia Panel: No results for input(s): VITAMINB12, FOLATE, FERRITIN, TIBC, IRON, RETICCTPCT in the last 72 hours. Urine analysis:    Component Value Date/Time   COLORURINE YELLOW 06/08/2017 2230   APPEARANCEUR CLEAR 06/08/2017 2230   LABSPEC 1.010 06/08/2017 2230   PHURINE 7.5 06/08/2017 2230   GLUCOSEU NEGATIVE 06/08/2017 2230   HGBUR NEGATIVE 06/08/2017 2230   BILIRUBINUR NEGATIVE 06/08/2017 2230   KETONESUR NEGATIVE 06/08/2017 2230   PROTEINUR NEGATIVE 06/08/2017 2230   NITRITE NEGATIVE 06/08/2017 2230   LEUKOCYTESUR MODERATE (A) 06/08/2017 2230   Sepsis Labs: @LABRCNTIP (procalcitonin:4,lacticidven:4) )No results found for this or any previous visit (from the past 240 hour(s)).   Radiological Exams on Admission: Dg  Chest Portable 1 View  Result Date: 06/08/2017 CLINICAL DATA:  Larey Seat this evening, femur fracture. EXAM: PORTABLE CHEST 1 VIEW COMPARISON:  None. FINDINGS: Cardiomediastinal silhouette is normal. Mild bronchitic changes without pleural effusion or focal consolidation. No pneumothorax. LEFT shoulder arthroplasty. Soft tissue planes are nonsuspicious. IMPRESSION: Mild bronchitic changes. Electronically Signed   By: Awilda Metro M.D.   On: 06/08/2017 22:45   Dg Knee Complete 4 Views Right  Result Date: 06/08/2017 CLINICAL DATA:  Larey Seat on right knee EXAM: RIGHT KNEE - COMPLETE 4+ VIEW COMPARISON:  None. FINDINGS: Status post right knee replacement. Acute fracture of the distal femur above the prosthesis. This demonstrates varus  angulation of the distal femoral fracture fragment and lower leg. There is about 1/2 shaft diameter of medial and posterior displacement of the distal fracture fragments. IMPRESSION: Status post right knee replacement with acute displaced and slightly angulated distal femoral fracture about a cm above the femoral compliment of the knee replacement Electronically Signed   By: Jasmine Pang M.D.   On: 06/08/2017 21:31    EKG: Independently reviewed. Sinus rhythm, low-voltage QRS.   Assessment/Plan  1. Distal right femur fracture - Pt presents with severe right knee pain and inability to bear wt after a mechanical fall  - She is found to have acute fracture of the distal right femur  - Orthopedic surgery is consulting and much appreciated, planning for possible surgical repair on 06/09/17  - Based on the available data, Melinda Montgomery presents a 0.2% risk probability of perioperative MI or cardiac arrest per Nolon Nations al  - Plan to keep NPO, hydrate with NS, control pain, continue supportive care    2. Hypertension  - BP at goal  - Continue Norvasc, hold benazepril for non-cardiac surgery  - Use hydralazine IVP's prn    3. Hypothyroidism  - Continue Synthroid    4. UTI - Pt reports urinary urgency and frequency, denies flank pain, fevers, or gross hematuria  - UA is consistent with infection and sample sent for culture  - She has reported hx of severe penicillin allergy and will be treated with ciprofloxacin pending cultures   DVT prophylaxis: SCD's  Code Status: Full  Family Communication: Discussed with patient Disposition Plan: Admit to med-surg Consults called: Orthopedic surgery Admission status: Inpatient    Briscoe Deutscher, MD [facility] Pager [phone]  If 7PM-7AM, please contact night-coverage [email or web address] Password [facility]  06/09/2017, 12:58 AM

## 2017-06-09 NOTE — Op Note (Addendum)
preop diagnosis : Right distal femoral shaft fracture  Postop diagnosis: Same   Procedure: Right femoral nail with proximal distal interlock, retrograde for closed distal femur shaft fracture above total knee arthroplasty.  Surgeon: Annell Greening M.D.  Anesthesia spinal +20 mL Marcaine local  EBL: See anesthetic record  Implants: Biomet retrograde femoral nail with proximal distal interlocks [id number] mm nail  Procedure after induction of spinal anesthesia standard prepping and draping with split sheets and drapes preoperative Ancef prophylaxis timeout procedure impervious stockinette Caban with paired sterile skin Loraine Leriche was used on her old total knee arthroplasty incisionand Betadine Steri-Drape applied. Old incision was opened medial parapatellar incision was made using the Bovie. There is clear fluid inside the knee joint patella was subluxed and patient had a rotating platform with the central post. There was a wide gap in the box is able to drill the starter pin overreamed with a 10.5 drill and then passed the beaded tip ride up into the intertrochanteric region of the femur confirmed under fluoroscopy that was draped out. Sequential reaming up to 13 and a 12 mm nail was placed. Interlocks are placed. The jig got loose and 3 screws missed and the jig was tightened and screws placed and rechecked under fluro.   When final spot pictures  All 4 screws were throught the rod.  Proximal most the transverse screw from lateral to medial was right adjacent to fracture site and tightening down the screw the penetrated through the cortex. Several attempts  made to back and out however the screw was blocked by the cortex and  would not back out and was left in countersunk position.The screw capture handle would not capture the screw.  With palpation the screw medially was not prominent through the skin.Attempted again engaging  with thescrew capture was unsuccessful., Cobb elevated on the end of the screw medially   Through the incision in thesubtendinousthe tissue was tried to push screw from the medial side turning the screw at the same time and the screw did not back out. Copious irrigation proximal interlock them infrequently free hand technique bicortical. Irrigation with saline solution in standard closure with #1 Vicryl 2-0 Vicryl Subtendinous Tissue Skin Staple Closure and Closure of the Interlock Screw Areas. Postoperative Dressing Was Applied and Patient Was Transferred Recovery Room.

## 2017-06-09 NOTE — Consult Note (Signed)
 Reason for Consult:right distal femoral shaft fracture above TKA Referring Physician: R. Schertz MD  Melinda Montgomery is an 76 y.o. female.  HPI: 76-year-old female here with her husband originally from [place] and live last 10+ years in [place] was visiting family and missed the last step fell suffering a right closed distal femoral shaft fracture above total knee arthroplasty. Patient had total knee arthroplasty originally and then polyethylene revision in 2016. Knees been doing well since that time no right hip surgery. She has had a left reverse total shoulder performed. Patient acute pain severe unable ambulate with distal femoral deformity.  Past Medical History:  Diagnosis Date  . Hypertension   . Thyroid disease     Past Surgical History:  Procedure Laterality Date  . REPLACEMENT TOTAL KNEE Right   . TOTAL SHOULDER REPLACEMENT Left     History reviewed. No pertinent family history.  Social History:  reports that she has never smoked. She has never used smokeless tobacco. She reports that she does not drink alcohol or use drugs.  Allergies:  Allergies  Allergen Reactions  . Asa [Aspirin]     Stomach burning   . Penicillins Other (See Comments)    Unknown reaction  Has patient had a PCN reaction causing immediate rash, facial/tongue/throat swelling, SOB or lightheadedness with hypotension: n/a Has patient had a PCN reaction causing severe rash involving mucus membranes or skin necrosis: n/a Has patient had a PCN reaction that required hospitalization: n/a Has patient had a PCN reaction occurring within the last 10 years: n/a If all of the above answers are "NO", then may proceed with Cephalosporin use.     Medications: I have reviewed the patient's current medications.  Results for orders placed or performed during the hospital encounter of 06/08/17 (from the past 48 hour(s))  Basic metabolic panel     Status: Abnormal   Collection Time: 06/08/17  9:45 PM  Result  Value Ref Range   Sodium 140 135 - 145 mmol/L   Potassium 4.4 3.5 - 5.1 mmol/L   Chloride 105 101 - 111 mmol/L   CO2 26 22 - 32 mmol/L   Glucose, Bld 137 (H) 65 - 99 mg/dL   BUN 21 (H) 6 - 20 mg/dL   Creatinine, Ser 0.91 0.44 - 1.00 mg/dL   Calcium 9.0 8.9 - 10.3 mg/dL   GFR calc non Af Amer 59 (L) >60 mL/min   GFR calc Af Amer >60 >60 mL/min    Comment: (NOTE) The eGFR has been calculated using the CKD EPI equation. This calculation has not been validated in all clinical situations. eGFR's persistently <60 mL/min signify possible Chronic Kidney Disease.    Anion gap 9 5 - 15  CBC with Differential     Status: None   Collection Time: 06/08/17  9:45 PM  Result Value Ref Range   WBC 7.3 4.0 - 10.5 K/uL   RBC 4.24 3.87 - 5.11 MIL/uL   Hemoglobin 12.7 12.0 - 15.0 g/dL   HCT 39.1 36.0 - 46.0 %   MCV 92.2 78.0 - 100.0 fL   MCH 30.0 26.0 - 34.0 pg   MCHC 32.5 30.0 - 36.0 g/dL   RDW 14.0 11.5 - 15.5 %   Platelets 213 150 - 400 K/uL   Neutrophils Relative % 69 %   Neutro Abs 5.0 1.7 - 7.7 K/uL   Lymphocytes Relative 17 %   Lymphs Abs 1.2 0.7 - 4.0 K/uL   Monocytes Relative 12 %   Monocytes   Absolute 0.9 0.1 - 1.0 K/uL   Eosinophils Relative 2 %   Eosinophils Absolute 0.2 0.0 - 0.7 K/uL   Basophils Relative 0 %   Basophils Absolute 0.0 0.0 - 0.1 K/uL  Protime-INR     Status: None   Collection Time: 06/08/17  9:45 PM  Result Value Ref Range   Prothrombin Time 12.0 11.4 - 15.2 seconds   INR 0.89   Urinalysis, Routine w reflex microscopic     Status: Abnormal   Collection Time: 06/08/17 10:30 PM  Result Value Ref Range   Color, Urine YELLOW YELLOW   APPearance CLEAR CLEAR   Specific Gravity, Urine 1.010 1.005 - 1.030   pH 7.5 5.0 - 8.0   Glucose, UA NEGATIVE NEGATIVE mg/dL   Hgb urine dipstick NEGATIVE NEGATIVE   Bilirubin Urine NEGATIVE NEGATIVE   Ketones, ur NEGATIVE NEGATIVE mg/dL   Protein, ur NEGATIVE NEGATIVE mg/dL   Nitrite NEGATIVE NEGATIVE   Leukocytes, UA  MODERATE (A) NEGATIVE  Urinalysis, Microscopic (reflex)     Status: Abnormal   Collection Time: 06/08/17 10:30 PM  Result Value Ref Range   RBC / HPF 0-5 0 - 5 RBC/hpf   WBC, UA TOO NUMEROUS TO COUNT 0 - 5 WBC/hpf   Bacteria, UA MANY (A) NONE SEEN   Squamous Epithelial / LPF 0-5 (A) NONE SEEN  Type and screen [facility]     Status: None   Collection Time: 06/09/17  1:07 AM  Result Value Ref Range   ABO/RH(D) A POS    Antibody Screen NEG    Sample Expiration 06/12/2017   Surgical pcr screen     Status: None   Collection Time: 06/09/17  2:00 AM  Result Value Ref Range   MRSA, PCR NEGATIVE NEGATIVE   Staphylococcus aureus NEGATIVE NEGATIVE    Comment:        The Xpert SA Assay (FDA approved for NASAL specimens in patients over 21 years of age), is one component of a comprehensive surveillance program.  Test performance has been validated by [facility] for patients greater than or equal to 1 year old. It is not intended to diagnose infection nor to guide or monitor treatment.     Dg Chest Portable 1 View  Result Date: 06/08/2017 CLINICAL DATA:  Fell this evening, femur fracture. EXAM: PORTABLE CHEST 1 VIEW COMPARISON:  None. FINDINGS: Cardiomediastinal silhouette is normal. Mild bronchitic changes without pleural effusion or focal consolidation. No pneumothorax. LEFT shoulder arthroplasty. Soft tissue planes are nonsuspicious. IMPRESSION: Mild bronchitic changes. Electronically Signed   By: Courtnay  Bloomer M.D.   On: 06/08/2017 22:45   Dg Knee Complete 4 Views Right  Result Date: 06/08/2017 CLINICAL DATA:  Fell on right knee EXAM: RIGHT KNEE - COMPLETE 4+ VIEW COMPARISON:  None. FINDINGS: Status post right knee replacement. Acute fracture of the distal femur above the prosthesis. This demonstrates varus angulation of the distal femoral fracture fragment and lower leg. There is about 1/2 shaft diameter of medial and posterior displacement of the distal  fracture fragments. IMPRESSION: Status post right knee replacement with acute displaced and slightly angulated distal femoral fracture about a cm above the femoral compliment of the knee replacement Electronically Signed   By: Kim  Fujinaga M.D.   On: 06/08/2017 21:31    Review of Systems  Constitutional: Negative for chills and fever.  HENT: Negative for ear pain.   Respiratory: Negative for cough.   Cardiovascular: Negative for chest pain and palpitations.         Hypertension  Gastrointestinal: Negative.   Genitourinary: Positive for urgency.  Musculoskeletal:       Previous right total knee arthroplasty with polyethylene revision. Most recent surgery 2016. Left reverse total shoulder arthroplasty.  Endo/Heme/Allergies:       History of hypothyroidism   Blood pressure 128/60, pulse 93, temperature 99.1 F (37.3 C), temperature source Oral, resp. rate 16, height 5' 3" (1.6 m), weight 160 lb (72.6 kg), SpO2 95 %. Physical Exam  Assessment/Plan: Plan open reduction internal fixation with retrograde femoral nail. Patient has a Stryker total knee arthroplasty which has a wide box on the femoral component. Procedure discussed with surgery discussed questions were answered she understands and agrees to proceed.  Likely UTI.   Mark C Yates 06/09/2017, 7:35 AM      

## 2017-06-09 NOTE — Progress Notes (Addendum)
[facility] Progress Note  Subjective: alert, no c/o's. Pain well controlled w IV and po meds. No n/v.    Vitals:   06/08/17 2049 06/08/17 2302 06/09/17 0000 06/09/17 0606  BP: 135/67 135/63 133/65 128/60  Pulse: 78 81 80 93  Resp: 19 15 16 16   Temp: 98.1 F (36.7 C)  98.8 F (37.1 C) 99.1 F (37.3 C)  TempSrc: Oral  Oral Oral  SpO2: 100% 96% 99% 95%  Weight: 72.6 kg (160 lb)     Height: 5\' 3"  (1.6 m)       Inpatient medications: . amLODipine  5 mg Oral Daily  . atorvastatin  20 mg Oral Daily  . cholecalciferol  400 Units Oral Daily  . darifenacin  15 mg Oral Daily  . levothyroxine  150 mcg Oral QAC breakfast  . propranolol  60 mg Oral TID  . vitamin C  500 mg Oral Daily   . sodium chloride 100 mL/hr at 06/09/17 0213  . ciprofloxacin Stopped (06/09/17 0352)   bisacodyl, hydrALAZINE, HYDROcodone-acetaminophen, morphine injection, senna-docusate  Exam: Alert, no distress NO jvd Chest cta bilat RRR no mrg Abd soft ntnd no ascites Ext RLE in brace to mid-thigh, trace pedal edema LLE no edema No wound or ulcers NF, ox 3  UA > many bact, TNTC wbc, 0-5 rbc/ epi's CXR - clear     Impression: 1.  R distal femur fracture (peri-prosthetic, hx R TKR) - per ED MD Dr Ophelia Charter to see today and prob surgery today. Pain controlled w po Vicodin and low-dose MSO4 prn.  Pt stable, no chronic cardiac or resp conditions.  BP controlled.  2.  Pyuria/ bacteriuria - asymptomatic, prob UTI, on IV cipro, cont for now , f/u cx.   3.  HTN - bp's controlled, cont propranolol/ norvasc. Holding ARB for non-cardiac surgery.  4.  Hx R TKR and L total shoulder 5.  Hypothyroid - stable   Plan - as above   Vinson Moselle MD [facility] pgr [phone] 06/09/2017, 7:17 AM    Recent Labs Lab 06/08/17 2145  NA 140  K 4.4  CL 105  CO2 26  GLUCOSE 137*  BUN 21*  CREATININE 0.91  CALCIUM 9.0   No results for input(s): AST, ALT, ALKPHOS, BILITOT, PROT, ALBUMIN in the  last 168 hours.  Recent Labs Lab 06/08/17 2145  WBC 7.3  NEUTROABS 5.0  HGB 12.7  HCT 39.1  MCV 92.2  PLT 213   Iron/TIBC/Ferritin/ %Sat No results found for: IRON, TIBC, FERRITIN, IRONPCTSAT

## 2017-06-09 NOTE — Transfer of Care (Signed)
Immediate Anesthesia Transfer of Care Note  Patient: Melinda Montgomery  Procedure(s) Performed: Procedure(s): INTRAMEDULLARY (IM) RETROGRADE FEMORAL NAILING (Right)  Patient Location: PACU  Anesthesia Type:Spinal  Level of Consciousness:  sedated, patient cooperative and responds to stimulation  Airway & Oxygen Therapy:Patient Spontanous Breathing and Patient connected to face mask oxgen  Post-op Assessment:  Report given to PACU RN and Post -op Vital signs reviewed and stable  Post vital signs:  Reviewed and stable  Last Vitals:  Vitals:   06/09/17 0606 06/09/17 1051  BP: 128/60 124/65  Pulse: 93 84  Resp: 16 16  Temp: 37.3 C 37 C  SpO2: 95% 93%    Complications: No apparent anesthesia complications

## 2017-06-09 NOTE — Anesthesia Postprocedure Evaluation (Signed)
Anesthesia Post Note  Patient: Melinda Montgomery  Procedure(s) Performed: Procedure(s) (LRB): INTRAMEDULLARY (IM) RETROGRADE FEMORAL NAILING (Right)     Anesthesia Post Evaluation  Last Vitals:  Vitals:   06/09/17 1530 06/09/17 1545  BP: (!) 134/45 (!) 141/65  Pulse: 74 74  Resp: 11 13  Temp:    SpO2: 94% 94%    Last Pain:  Vitals:   06/09/17 1051  TempSrc: Oral  PainSc:                  Brevin Mcfadden A.

## 2017-06-09 NOTE — Interval H&P Note (Signed)
History and Physical Interval Note:  06/09/2017 12:06 PM  Melinda Montgomery  has presented today for surgery, with the diagnosis of periprosthetic fracture right knee   The various methods of treatment have been discussed with the patient and family. After consideration of risks, benefits and other options for treatment, the patient has consented to  Procedure(s): INTRAMEDULLARY (IM) RETROGRADE FEMORAL NAILING (Right) as a surgical intervention .  The patient's history has been reviewed, patient examined, no change in status, stable for surgery.  I have reviewed the patient's chart and labs.  Questions were answered to the patient's satisfaction.     Eldred Manges

## 2017-06-09 NOTE — Brief Op Note (Signed)
06/08/2017 - 06/09/2017  2:32 PM  PATIENT:  Melinda Montgomery  76 y.o. female  PRE-OPERATIVE DIAGNOSIS:  periprosthetic fracture right knee  Distal femoral closed shaft fracture   POST-OPERATIVE DIAGNOSIS: same PROCEDURE:  Procedure(s): INTRAMEDULLARY (IM) RETROGRADE FEMORAL NAILING (Right)  SURGEON:  Surgeon(s) and Role:    * Eldred Manges, MD - Primary  PHYSICIAN ASSISTANT:   ASSISTANTS: none   ANESTHESIA:   local and spinal  EBL:  Total I/O In: 2098.3 [I.V.:1898.3; IV Piggyback:200] Out: 1600 [Urine:1500; Blood:100]  BLOOD ADMINISTERED:none  DRAINS: none   LOCAL MEDICATIONS USED:  MARCAINE     SPECIMEN:  No Specimen  DISPOSITION OF SPECIMEN:  N/A  COUNTS:  YES  TOURNIQUET:  * No tourniquets in log *  DICTATION: .Dragon Dictation  PLAN OF CARE: already IP  PATIENT DISPOSITION:  PACU - hemodynamically stable.   Delay start of Pharmacological VTE agent (>24hrs) due to surgical blood loss or risk of bleeding: yes

## 2017-06-09 NOTE — Anesthesia Preprocedure Evaluation (Addendum)
Anesthesia Evaluation  Patient identified by MRN, date of birth, ID band Patient awake    Reviewed: Allergy & Precautions, NPO status , Patient's Chart, lab work & pertinent test results  Airway Mallampati: II  TM Distance: >3 FB Neck ROM: Full    Dental no notable dental hx. (+) Teeth Intact, Caps   Pulmonary neg pulmonary ROS,    Pulmonary exam normal breath sounds clear to auscultation       Cardiovascular hypertension, Pt. on medications Normal cardiovascular exam Rhythm:Regular Rate:Normal     Neuro/Psych negative neurological ROS  negative psych ROS   GI/Hepatic negative GI ROS, Neg liver ROS,   Endo/Other  Hypothyroidism Hyperlipidemia   Renal/GU Lower UTI currently  negative genitourinary   Musculoskeletal  (+) Arthritis , Osteoarthritis,  Periprosthetic right distal femur Fx   Abdominal   Peds  Hematology   Anesthesia Other Findings   Reproductive/Obstetrics                             Anesthesia Physical Anesthesia Plan  ASA: II  Anesthesia Plan: Spinal   Post-op Pain Management:    Induction:   PONV Risk Score and Plan: 4 or greater and Ondansetron, Dexamethasone, Midazolam, Propofol infusion and Metaclopromide  Airway Management Planned: Natural Airway, Nasal Cannula and Simple Face Mask  Additional Equipment:   Intra-op Plan:   Post-operative Plan:   Informed Consent: I have reviewed the patients History and Physical, chart, labs and discussed the procedure including the risks, benefits and alternatives for the proposed anesthesia with the patient or authorized representative who has indicated his/her understanding and acceptance.   Dental advisory given  Plan Discussed with: CRNA, Anesthesiologist and Surgeon  Anesthesia Plan Comments:         Anesthesia Quick Evaluation

## 2017-06-09 NOTE — H&P (View-Only) (Signed)
Reason for Consult:right distal femoral shaft fracture above TKA Referring Physician: Mickel Crow MD  Melinda Montgomery is an 76 y.o. female.  HPI: 76 year old female here with her husband originally from [place] and live last 10+ years in [place] was visiting family and missed the last step fell suffering a right closed distal femoral shaft fracture above total knee arthroplasty. Patient had total knee arthroplasty originally and then polyethylene revision in 2016. Knees been doing well since that time no right hip surgery. She has had a left reverse total shoulder performed. Patient acute pain severe unable ambulate with distal femoral deformity.  Past Medical History:  Diagnosis Date  . Hypertension   . Thyroid disease     Past Surgical History:  Procedure Laterality Date  . REPLACEMENT TOTAL KNEE Right   . TOTAL SHOULDER REPLACEMENT Left     History reviewed. No pertinent family history.  Social History:  reports that she has never smoked. She has never used smokeless tobacco. She reports that she does not drink alcohol or use drugs.  Allergies:  Allergies  Allergen Reactions  . Asa [Aspirin]     Stomach burning   . Penicillins Other (See Comments)    Unknown reaction  Has patient had a PCN reaction causing immediate rash, facial/tongue/throat swelling, SOB or lightheadedness with hypotension: n/a Has patient had a PCN reaction causing severe rash involving mucus membranes or skin necrosis: n/a Has patient had a PCN reaction that required hospitalization: n/a Has patient had a PCN reaction occurring within the last 10 years: n/a If all of the above answers are "NO", then may proceed with Cephalosporin use.     Medications: I have reviewed the patient's current medications.  Results for orders placed or performed during the hospital encounter of 06/08/17 (from the past 48 hour(s))  Basic metabolic panel     Status: Abnormal   Collection Time: 06/08/17  9:45 PM  Result  Value Ref Range   Sodium 140 135 - 145 mmol/L   Potassium 4.4 3.5 - 5.1 mmol/L   Chloride 105 101 - 111 mmol/L   CO2 26 22 - 32 mmol/L   Glucose, Bld 137 (H) 65 - 99 mg/dL   BUN 21 (H) 6 - 20 mg/dL   Creatinine, Ser 0.91 0.44 - 1.00 mg/dL   Calcium 9.0 8.9 - 10.3 mg/dL   GFR calc non Af Amer 59 (L) >60 mL/min   GFR calc Af Amer >60 >60 mL/min    Comment: (NOTE) The eGFR has been calculated using the CKD EPI equation. This calculation has not been validated in all clinical situations. eGFR's persistently <60 mL/min signify possible Chronic Kidney Disease.    Anion gap 9 5 - 15  CBC with Differential     Status: None   Collection Time: 06/08/17  9:45 PM  Result Value Ref Range   WBC 7.3 4.0 - 10.5 K/uL   RBC 4.24 3.87 - 5.11 MIL/uL   Hemoglobin 12.7 12.0 - 15.0 g/dL   HCT 39.1 36.0 - 46.0 %   MCV 92.2 78.0 - 100.0 fL   MCH 30.0 26.0 - 34.0 pg   MCHC 32.5 30.0 - 36.0 g/dL   RDW 14.0 11.5 - 15.5 %   Platelets 213 150 - 400 K/uL   Neutrophils Relative % 69 %   Neutro Abs 5.0 1.7 - 7.7 K/uL   Lymphocytes Relative 17 %   Lymphs Abs 1.2 0.7 - 4.0 K/uL   Monocytes Relative 12 %   Monocytes  Absolute 0.9 0.1 - 1.0 K/uL   Eosinophils Relative 2 %   Eosinophils Absolute 0.2 0.0 - 0.7 K/uL   Basophils Relative 0 %   Basophils Absolute 0.0 0.0 - 0.1 K/uL  Protime-INR     Status: None   Collection Time: 06/08/17  9:45 PM  Result Value Ref Range   Prothrombin Time 12.0 11.4 - 15.2 seconds   INR 0.89   Urinalysis, Routine w reflex microscopic     Status: Abnormal   Collection Time: 06/08/17 10:30 PM  Result Value Ref Range   Color, Urine YELLOW YELLOW   APPearance CLEAR CLEAR   Specific Gravity, Urine 1.010 1.005 - 1.030   pH 7.5 5.0 - 8.0   Glucose, UA NEGATIVE NEGATIVE mg/dL   Hgb urine dipstick NEGATIVE NEGATIVE   Bilirubin Urine NEGATIVE NEGATIVE   Ketones, ur NEGATIVE NEGATIVE mg/dL   Protein, ur NEGATIVE NEGATIVE mg/dL   Nitrite NEGATIVE NEGATIVE   Leukocytes, UA  MODERATE (A) NEGATIVE  Urinalysis, Microscopic (reflex)     Status: Abnormal   Collection Time: 06/08/17 10:30 PM  Result Value Ref Range   RBC / HPF 0-5 0 - 5 RBC/hpf   WBC, UA TOO NUMEROUS TO COUNT 0 - 5 WBC/hpf   Bacteria, UA MANY (A) NONE SEEN   Squamous Epithelial / LPF 0-5 (A) NONE SEEN  Type and screen [facility]     Status: None   Collection Time: 06/09/17  1:07 AM  Result Value Ref Range   ABO/RH(D) A POS    Antibody Screen NEG    Sample Expiration 06/12/2017   Surgical pcr screen     Status: None   Collection Time: 06/09/17  2:00 AM  Result Value Ref Range   MRSA, PCR NEGATIVE NEGATIVE   Staphylococcus aureus NEGATIVE NEGATIVE    Comment:        The Xpert SA Assay (FDA approved for NASAL specimens in patients over 35 years of age), is one component of a comprehensive surveillance program.  Test performance has been validated by [facility] for patients greater than or equal to 93 year old. It is not intended to diagnose infection nor to guide or monitor treatment.     Dg Chest Portable 1 View  Result Date: 06/08/2017 CLINICAL DATA:  [place] this evening, femur fracture. EXAM: PORTABLE CHEST 1 VIEW COMPARISON:  None. FINDINGS: Cardiomediastinal silhouette is normal. Mild bronchitic changes without pleural effusion or focal consolidation. No pneumothorax. LEFT shoulder arthroplasty. Soft tissue planes are nonsuspicious. IMPRESSION: Mild bronchitic changes. Electronically Signed   By: Elon Alas M.D.   On: 06/08/2017 22:45   Dg Knee Complete 4 Views Right  Result Date: 06/08/2017 CLINICAL DATA:  [place] on right knee EXAM: RIGHT KNEE - COMPLETE 4+ VIEW COMPARISON:  None. FINDINGS: Status post right knee replacement. Acute fracture of the distal femur above the prosthesis. This demonstrates varus angulation of the distal femoral fracture fragment and lower leg. There is about 1/2 shaft diameter of medial and posterior displacement of the distal  fracture fragments. IMPRESSION: Status post right knee replacement with acute displaced and slightly angulated distal femoral fracture about a cm above the femoral compliment of the knee replacement Electronically Signed   By: Donavan Foil M.D.   On: 06/08/2017 21:31    Review of Systems  Constitutional: Negative for chills and fever.  HENT: Negative for ear pain.   Respiratory: Negative for cough.   Cardiovascular: Negative for chest pain and palpitations.  Hypertension  Gastrointestinal: Negative.   Genitourinary: Positive for urgency.  Musculoskeletal:       Previous right total knee arthroplasty with polyethylene revision. Most recent surgery 2016. Left reverse total shoulder arthroplasty.  Endo/Heme/Allergies:       History of hypothyroidism   Blood pressure 128/60, pulse 93, temperature 99.1 F (37.3 C), temperature source Oral, resp. rate 16, height _0  (1.6 m), weight 160 lb (72.6 kg), SpO2 95 %. Physical Exam  Assessment/Plan: Plan open reduction internal fixation with retrograde femoral nail. Patient has a Stryker total knee arthroplasty which has a wide box on the femoral component. Procedure discussed with surgery discussed questions were answered she understands and agrees to proceed.  Likely UTI.   Marybelle Killings 06/09/2017, 7:35 AM

## 2017-06-10 ENCOUNTER — Encounter (HOSPITAL_COMMUNITY): Payer: Self-pay | Admitting: Orthopaedic Surgery

## 2017-06-10 DIAGNOSIS — S72441A Displaced fracture of lower epiphysis (separation) of right femur, initial encounter for closed fracture: Secondary | ICD-10-CM

## 2017-06-10 DIAGNOSIS — N390 Urinary tract infection, site not specified: Secondary | ICD-10-CM

## 2017-06-10 LAB — CBC
HEMATOCRIT: 32.2 % — AB (ref 36.0–46.0)
HEMOGLOBIN: 10.5 g/dL — AB (ref 12.0–15.0)
MCH: 29.3 pg (ref 26.0–34.0)
MCHC: 32.6 g/dL (ref 30.0–36.0)
MCV: 89.9 fL (ref 78.0–100.0)
Platelets: 185 10*3/uL (ref 150–400)
RBC: 3.58 MIL/uL — ABNORMAL LOW (ref 3.87–5.11)
RDW: 13.9 % (ref 11.5–15.5)
WBC: 10.2 10*3/uL (ref 4.0–10.5)

## 2017-06-10 LAB — BASIC METABOLIC PANEL
ANION GAP: 8 (ref 5–15)
BUN: 13 mg/dL (ref 6–20)
CALCIUM: 8.2 mg/dL — AB (ref 8.9–10.3)
CHLORIDE: 107 mmol/L (ref 101–111)
CO2: 22 mmol/L (ref 22–32)
CREATININE: 0.72 mg/dL (ref 0.44–1.00)
GFR calc non Af Amer: >60 mL/min (ref >60)
GLUCOSE: 112 mg/dL — AB (ref 65–99)
Potassium: 3.8 mmol/L (ref 3.5–5.1)
Sodium: 137 mmol/L (ref 135–145)

## 2017-06-10 MED ORDER — HYDROCODONE-ACETAMINOPHEN 5-325 MG PO TABS
1.0000 | ORAL_TABLET | ORAL | Status: DC | PRN
  Administered 2017-06-10 – 2017-06-11 (×4): 2 via ORAL
  Filled 2017-06-10 (×3): qty 2

## 2017-06-10 MED ORDER — RIVAROXABAN 10 MG PO TABS
10.0000 mg | ORAL_TABLET | Freq: Every day | ORAL | Status: DC
  Administered 2017-06-10 – 2017-06-11 (×2): 10 mg via ORAL
  Filled 2017-06-10 (×2): qty 1

## 2017-06-10 MED ORDER — HYDROCODONE-ACETAMINOPHEN 5-325 MG PO TABS: 1.0000 | ORAL_TABLET | Freq: Four times a day (QID) | ORAL | 0 refills | Status: AC | PRN

## 2017-06-10 MED ORDER — CIPROFLOXACIN HCL 500 MG PO TABS
500.0000 mg | ORAL_TABLET | Freq: Two times a day (BID) | ORAL | Status: DC
  Administered 2017-06-10 – 2017-06-11 (×2): 500 mg via ORAL
  Filled 2017-06-10 (×2): qty 1

## 2017-06-10 MED ORDER — POLYETHYLENE GLYCOL 3350 17 G PO PACK
17.0000 g | PACK | Freq: Every day | ORAL | Status: DC
  Administered 2017-06-10 – 2017-06-11 (×2): 17 g via ORAL
  Filled 2017-06-10 (×2): qty 1

## 2017-06-10 NOTE — Evaluation (Addendum)
Physical Therapy Evaluation Patient Details Name: Melinda Montgomery MRN: [id number] DOB: 1941/07/20 Today's Date: 06/10/2017   History of Present Illness  76 yo female vsiting family, missed step. S/P Right femoral nail with proximal distal interlock, retrograde for closed distal femur shaft fracture above total knee arthroplasty,,Biomet retrograde femoral nail with proximal distal interlocks  nail on 06/09/17, H/O L reverse total shoulder  Clinical Impression  The patient tolerated ambulating x 50'.   Patient visiting and will return to  In laws' home locally. Has flight to [facility] thursday. Deferr to MD re: travel. Pt admitted with above diagnosis. Pt currently with functional limitations due to the deficits listed below (see PT Problem List).  Pt will benefit from skilled PT to increase their independence and safety with mobility to allow discharge to the venue listed below.       Follow Up Recommendations Home health PT;Supervision/Assistance - 24 hour    Equipment Recommendations  Rolling walker with 5" wheels    Recommendations for Other Services       Precautions / Restrictions Precautions Precautions: Fall Precaution Comments: OK for ROM  right knee Required Braces or Orthoses: Knee Immobilizer - right Knee Immobilizer - Left: On when out of bed or walking Restrictions Weight Bearing Restrictions: Yes RLE Weight Bearing: TDWB    Mobility  Bed Mobility               General bed mobility comments: pt seated at EOB upon arrival  Transfers Overall transfer level: Needs assistance Equipment used: Rolling walker (2 wheeled) Transfers: Sit to/from Stand Sit to Stand: Min assist         General transfer comment: cues for hand placement, steadying assist  Ambulation/Gait Ambulation/Gait assistance: Min assist Ambulation Distance (Feet): 50 Feet Assistive device: Rolling walker (2 wheeled) Gait Pattern/deviations: Step-to pattern;Antalgic Gait velocity: decreased    General Gait Details: cues for sequence  Stairs            Wheelchair Mobility    Modified Rankin (Stroke Patients Only)       Balance Overall balance assessment: Needs assistance   Sitting balance-Leahy Scale: Good       Standing balance-Leahy Scale: Fair Standing balance comment: able to release walker to perform pericare                             Pertinent Vitals/Pain Pain Assessment: 0-10 Pain Score: 5  Pain Location: R LE Pain Descriptors / Indicators: Sore;Grimacing;Guarding Pain Intervention(s): Monitored during session;Premedicated before session;Repositioned;Ice applied    Home Living Family/patient expects to be discharged to:: Private residence Living Arrangements: Spouse/significant other Available Help at Discharge: Family;Available 24 hours/day Type of Home: House Home Access: Stairs to enter Entrance Stairs-Rails: Right Entrance Stairs-Number of Steps: 4 Home Layout: Two level;Able to live on main level with bedroom/bathroom   Additional Comments: walker and shower seat at home in [place]    Prior Function Level of Independence: Independent               Hand Dominance   Dominant Hand: Right    Extremity/Trunk Assessment   Upper Extremity Assessment Upper Extremity Assessment: Defer to OT evaluation    Lower Extremity Assessment Lower Extremity Assessment: RLE deficits/detail RLE Deficits / Details: KI in place,  able to bear weight     Cervical / Trunk Assessment Cervical / Trunk Assessment: Normal  Communication   Communication: No difficulties  Cognition Arousal/Alertness: Awake/alert Behavior During Therapy:  WFL for tasks assessed/performed Overall Cognitive Status: Within Functional Limits for tasks assessed                                        General Comments      Exercises     Assessment/Plan    PT Assessment Patient needs continued PT services  PT Problem List Decreased  strength;Decreased range of motion;Decreased activity tolerance;Decreased mobility;Decreased knowledge of precautions;Decreased safety awareness;Decreased knowledge of use of DME;Pain       PT Treatment Interventions DME instruction;Gait training;Stair training;Functional mobility training;Therapeutic activities;Therapeutic exercise;Patient/family education    PT Goals (Current goals can be found in the Care Plan section)  Acute Rehab PT Goals Patient Stated Goal: to return home to [facility] on Thursday PT Goal Formulation: With patient/family Time For Goal Achievement: 06/17/17    Frequency Min 6X/week   Barriers to discharge        Co-evaluation PT/OT/SLP Co-Evaluation/Treatment: Yes Reason for Co-Treatment: For patient/therapist safety PT goals addressed during session: Mobility/safety with mobility OT goals addressed during session: ADL's and self-care       AM-PAC PT "6 Clicks" Daily Activity  Outcome Measure Difficulty turning over in bed (including adjusting bedclothes, sheets and blankets)?: A Little Difficulty moving from lying on back to sitting on the side of the bed? : A Little Difficulty sitting down on and standing up from a chair with arms (e.g., wheelchair, bedside commode, etc,.)?: A Lot Help needed moving to and from a bed to chair (including a wheelchair)?: A Lot Help needed walking in hospital room?: A Lot Help needed climbing 3-5 steps with a railing? : Total 6 Click Score: 13    End of Session Equipment Utilized During Treatment: Gait belt;Right knee immobilizer Activity Tolerance: Patient tolerated treatment well Patient left: in chair;with call bell/phone within reach;with chair alarm set;with family/visitor present Nurse Communication: Mobility status PT Visit Diagnosis: Difficulty in walking, not elsewhere classified (R26.2);Pain Pain - Right/Left: Right Pain - part of body: Knee    Time: 0939-1010 PT Time Calculation (min) (ACUTE ONLY): 31  min   Charges:   PT Evaluation $PT Eval Low Complexity: 1 Low     PT G CodesBlanchard Kelch PT [phone]   Rada Hay 06/10/2017, 11:22 AM

## 2017-06-10 NOTE — Progress Notes (Signed)
PROGRESS NOTE    Melinda Montgomery  ZOX:[id number] DOB: 11-17-1940 DOA: 06/08/2017 PCP: System, Pcp Not In    Brief Narrative: Melinda Montgomery is a 76 y.o. female with medical history significant for hypertension and hypothyroidism, now presenting to the emergency department for evaluation of severe right knee pain and inability to bear weight after mechanical fall. Patient is here from her home in [place], visiting a friend, and has been experiencing some urinary urgency and frequency, but has been generally well with no fevers or chills, no cough or dyspnea, and no chest pain or palpitations. She reports feeling well today when she tripped and fell onto her side and experienced immediate and severe pain at the right knee. She denies hitting her head or losing consciousness. She was unable to bear weight after this. EMS was called out for evaluation. She was transported to the hospital. Patient is able to attend to all of her ADLs and reports an active lifestyle, never experiencing chest pain or palpitations, and with ability to ascend a flight of stairs without any significant respiratory symptoms.  [facility] ED Course: Upon arrival to the [facility] ED, patient is found to be afebrile, saturating well on room air, and with vitals otherwise stable. EKG features a sinus rhythm with low-voltage QRS. Chest x-ray demonstrated mild bronchitic changes and radiographs of the right femur demonstrate acute displaced and slightly angulated distal femur fracture. Chemistry panels notable for mildly elevated BUN to creatinine ratio. CBC is unremarkable. Urinalysis is suggestive of possible infection. Patient was treated with morphine and Zofran in the emergency department. Orthopedic surgery was consulted by the ED physician and recommended a medical admission, indicating that they would plan to operate on the patient on 06/09/2017. Patient remained medically stable and in no apparent respiratory distress and will  be admitted to the medical-surgical unit at [facility] for ongoing evaluation and management of distal right femur fracture after mechanical fall.  Assessment & Plan:   Active Problems:   Fracture of distal femur (HCC)   Essential hypertension   Hypothyroidism   Closed displaced fracture of lower epiphysis of right femur (HCC)   Acute lower UTI   Fall   Distal right femur fracture;  Underwent Intramedullary Retrograde Femoral nailing.   Started on xarelto for DVT prophylaxis.  Bowel regimen.   HTN; Continue with inderal.  Hold norvasc, Olmersartan SBP soft.   Hypothyroidism;  Continue with synthroid.   UTI;  Cipro BID Follow urine culture.     DVT prophylaxis: Xarelto Code Status: Full code.  Family Communication: care discussed with patient  Disposition Plan: plan to discharge in 24 hours.    Consultants:   ortho   Procedures:  Underwent Intramedullary Retrograde Femoral nailing.     Antimicrobials:   cipro    Subjective: She is feeling ok, denies dyspnea.  No BM yet   Objective: Vitals:   06/10/17 0159 06/10/17 0537 06/10/17 1018 06/10/17 1046  BP: (!) 120/54 (!) 117/49 (!) 120/57 (!) 115/52  Pulse: 74 74 74 71  Resp: 14 13 16 17   Temp: 98.4 F (36.9 C) 98.6 F (37 C) 98.6 F (37 C) 98.2 F (36.8 C)  TempSrc: Oral Oral Axillary Oral  SpO2: 96% 95% 95% 96%  Weight:      Height:        Intake/Output Summary (Last 24 hours) at 06/10/17 1312 Last data filed at 06/10/17 1255  Gross per 24 hour  Intake  4043.8 ml  Output             4355 ml  Net           -311.2 ml   Filed Weights   06/08/17 2049  Weight: 72.6 kg (160 lb)    Examination:  General exam: Appears calm and comfortable  Respiratory system: Clear to auscultation. Respiratory effort normal. Cardiovascular system: S1 & S2 heard, RRR. No JVD, murmurs, rubs, gallops or clicks. No pedal edema. Gastrointestinal system: Abdomen is nondistended, soft and  nontender. No organomegaly or masses felt. Normal bowel sounds heard. Central nervous system: Alert and oriented. No focal neurological deficits. Extremities: Symmetric 5 x 5 power. Clean  Dressing in right LE Skin: No rashes, lesions or ulcers Psychiatry: Judgement and insight appear normal. Mood & affect appropriate.     Data Reviewed: I have personally reviewed following labs and imaging studies  CBC:  Recent Labs Lab 06/08/17 2145 06/09/17 1844 06/10/17 0520  WBC 7.3 13.8* 10.2  NEUTROABS 5.0  --   --   HGB 12.7 12.7 10.5*  HCT 39.1 38.7 32.2*  MCV 92.2 92.1 89.9  PLT 213 207 185   Basic Metabolic Panel:  Recent Labs Lab 06/08/17 2145 06/09/17 1844 06/10/17 0520  NA 140  --  137  K 4.4  --  3.8  CL 105  --  107  CO2 26  --  22  GLUCOSE 137*  --  112*  BUN 21*  --  13  CREATININE 0.91 0.85 0.72  CALCIUM 9.0  --  8.2*   GFR: Estimated Creatinine Clearance: 56.2 mL/min (by C-G formula based on SCr of 0.72 mg/dL). Liver Function Tests: No results for input(s): AST, ALT, ALKPHOS, BILITOT, PROT, ALBUMIN in the last 168 hours. No results for input(s): LIPASE, AMYLASE in the last 168 hours. No results for input(s): AMMONIA in the last 168 hours. Coagulation Profile:  Recent Labs Lab 06/08/17 2145  INR 0.89   Cardiac Enzymes: No results for input(s): CKTOTAL, CKMB, CKMBINDEX, TROPONINI in the last 168 hours. BNP (last 3 results) No results for input(s): PROBNP in the last 8760 hours. HbA1C: No results for input(s): HGBA1C in the last 72 hours. CBG: No results for input(s): GLUCAP in the last 168 hours. Lipid Profile: No results for input(s): CHOL, HDL, LDLCALC, TRIG, CHOLHDL, LDLDIRECT in the last 72 hours. Thyroid Function Tests: No results for input(s): TSH, T4TOTAL, FREET4, T3FREE, THYROIDAB in the last 72 hours. Anemia Panel: No results for input(s): VITAMINB12, FOLATE, FERRITIN, TIBC, IRON, RETICCTPCT in the last 72 hours. Sepsis Labs: No results  for input(s): PROCALCITON, LATICACIDVEN in the last 168 hours.  Recent Results (from the past 240 hour(s))  Surgical pcr screen     Status: None   Collection Time: 06/09/17  2:00 AM  Result Value Ref Range Status   MRSA, PCR NEGATIVE NEGATIVE Final   Staphylococcus aureus NEGATIVE NEGATIVE Final    Comment:        The Xpert SA Assay (FDA approved for NASAL specimens in patients over 63 years of age), is one component of a comprehensive surveillance program.  Test performance has been validated by [facility] for patients greater than or equal to 32 year old. It is not intended to diagnose infection nor to guide or monitor treatment.          Radiology Studies: Dg Chest Portable 1 View  Result Date: 06/08/2017 CLINICAL DATA:  Larey Seat this evening, femur fracture. EXAM: PORTABLE CHEST 1 VIEW  COMPARISON:  None. FINDINGS: Cardiomediastinal silhouette is normal. Mild bronchitic changes without pleural effusion or focal consolidation. No pneumothorax. LEFT shoulder arthroplasty. Soft tissue planes are nonsuspicious. IMPRESSION: Mild bronchitic changes. Electronically Signed   By: Awilda Metro M.D.   On: 06/08/2017 22:45   Dg Knee Complete 4 Views Right  Result Date: 06/08/2017 CLINICAL DATA:  Larey Seat on right knee EXAM: RIGHT KNEE - COMPLETE 4+ VIEW COMPARISON:  None. FINDINGS: Status post right knee replacement. Acute fracture of the distal femur above the prosthesis. This demonstrates varus angulation of the distal femoral fracture fragment and lower leg. There is about 1/2 shaft diameter of medial and posterior displacement of the distal fracture fragments. IMPRESSION: Status post right knee replacement with acute displaced and slightly angulated distal femoral fracture about a cm above the femoral compliment of the knee replacement Electronically Signed   By: Jasmine Pang M.D.   On: 06/08/2017 21:31   Dg C-arm 61-120 Min-no Report  Result Date: 06/09/2017 CLINICAL DATA:  Repair of  right femoral fracture. EXAM: DG C-ARM 61-120 MIN-NO REPORT; RIGHT FEMUR 2 VIEWS FLUOROSCOPY TIME:  2 minutes and 1 second. Images: 5 COMPARISON:  None. FINDINGS: A rod has been placed across the distal femoral fracture with improved alignment. IMPRESSION: Right femoral fracture repair as above. Electronically Signed   By: Gerome Sam III M.D   On: 06/09/2017 14:33   Dg Femur, Min 2 Views Right  Result Date: 06/09/2017 CLINICAL DATA:  Repair of right femoral fracture. EXAM: DG C-ARM 61-120 MIN-NO REPORT; RIGHT FEMUR 2 VIEWS FLUOROSCOPY TIME:  2 minutes and 1 second. Images: 5 COMPARISON:  None. FINDINGS: A rod has been placed across the distal femoral fracture with improved alignment. IMPRESSION: Right femoral fracture repair as above. Electronically Signed   By: Gerome Sam III M.D   On: 06/09/2017 14:33        Scheduled Meds: . atorvastatin  20 mg Oral Daily  . cholecalciferol  400 Units Oral Daily  . darifenacin  7.5 mg Oral Daily  . levothyroxine  150 mcg Oral QAC breakfast  . Netarsudil Dimesylate  1 drop Both Eyes QHS  . pantoprazole  40 mg Oral Daily  . propranolol ER  60 mg Oral QPC breakfast  . rivaroxaban  10 mg Oral Daily  . timolol  1 drop Both Eyes QPC breakfast  . vitamin C  500 mg Oral Daily   Continuous Infusions: . sodium chloride 75 mL/hr at 06/10/17 1023  . ciprofloxacin 400 mg (06/10/17 1023)     LOS: 2 days    Time spent: 35 minutes.     Alba Cory, MD [facility] Pager [phone]  If 7PM-7AM, please contact night-coverage [email or web address] Password [email or web address] 06/10/2017, 1:12 PM

## 2017-06-10 NOTE — Progress Notes (Signed)
ANTICOAGULATION CONSULT NOTE - Initial Consult  Pharmacy Consult for xarelto Indication: VTE prophylaxis  Allergies  Allergen Reactions  . Asa [Aspirin]     Stomach burning   . Penicillins Other (See Comments)    Unknown reaction  Has patient had a PCN reaction causing immediate rash, facial/tongue/throat swelling, SOB or lightheadedness with hypotension: n/a Has patient had a PCN reaction causing severe rash involving mucus membranes or skin necrosis: n/a Has patient had a PCN reaction that required hospitalization: n/a Has patient had a PCN reaction occurring within the last 10 years: n/a If all of the above answers are "NO", then may proceed with Cephalosporin use.     Patient Measurements: Height: 5\' 3"  (160 cm) Weight: 160 lb (72.6 kg) IBW/kg (Calculated) : 52.4   Vital Signs: Temp: 98.2 F (36.8 C) (08/20 1046) Temp Source: Oral (08/20 1046) BP: 115/52 (08/20 1046) Pulse Rate: 71 (08/20 1046)  Labs:  Recent Labs  06/08/17 2145 06/09/17 1844 06/10/17 0520  HGB 12.7 12.7 10.5*  HCT 39.1 38.7 32.2*  PLT 213 207 185  LABPROT 12.0  --   --   INR 0.89  --   --   CREATININE 0.91 0.85 0.72    Estimated Creatinine Clearance: 56.2 mL/min (by C-G formula based on SCr of 0.72 mg/dL).   Medical History: Past Medical History:  Diagnosis Date  . Hypertension   . Thyroid disease     Medications:  Prescriptions Prior to Admission  Medication Sig Dispense Refill Last Dose  . amLODipine-olmesartan (AZOR) 5-20 MG tablet Take 1 tablet by mouth daily.   06/07/2017 at Unknown time  . atorvastatin (LIPITOR) 20 MG tablet Take 20 mg by mouth every evening.    06/07/2017 at Unknown time  . celecoxib (CELEBREX) 200 MG capsule Take 200 mg by mouth daily after breakfast.    06/08/2017 at Unknown time  . cholecalciferol (VITAMIN D) 400 units TABS tablet Take 400 Units by mouth 2 (two) times daily.    06/08/2017 at Unknown time  . levothyroxine (SYNTHROID, LEVOTHROID) 150 MCG tablet  Take 150 mcg by mouth daily before breakfast.   06/08/2017 at Unknown time  . omeprazole (PRILOSEC) 20 MG capsule Take 20 mg by mouth daily as needed (reflux).    unk  . propranolol ER (INDERAL LA) 60 MG 24 hr capsule Take 60 mg by mouth daily after breakfast.    06/08/2017 at 0900  . RHOPRESSA 0.02 % SOLN Place 1 drop into both eyes at bedtime.  0 06/08/2017 at Unknown time  . Timolol Maleate 0.5 % (DAILY) SOLN Place 1 drop into both eyes daily after breakfast.  6 06/08/2017 at Unknown time  . Trospium Chloride 60 MG CP24 Take 60 mg by mouth daily.   06/08/2017 at Unknown time  . vitamin C (ASCORBIC ACID) 500 MG tablet Take 500 mg by mouth daily.    06/08/2017 at Unknown time    Assessment: 76 yo F s/p IM femoral nailing on the right.  Pharmacy consulted to dose xarelto for VTE px x 2 weeks in patient with planned airplane trip.  Goal of Therapy:  VTE prevention   Plan:  xarelto 10 mg qday x 14 days  Herby Abraham, Pharm.D. [phone] 06/10/2017 11:37 AM

## 2017-06-10 NOTE — Progress Notes (Signed)
Nutrition Brief Note  Patient identified for consult per Hip Fracture Protocol.   Wt Readings from Last 15 Encounters:  06/08/17 160 lb (72.6 kg)    Body mass index is 28.34 kg/m. Patient meets criteria for overweight based on current BMI, appropriate for age. Pt is here from [place] and is visiting a friend. She fell from the bottom of a set of stairs and sustained a R femur fx. She had R knee replacement 2 years ago. Pt underwent surgery for femur fx yesterday afternoon.    Current diet order is Regular and pt consumed 50% of dinner last night and 100% of breakfast this AM. No previous weight recorded as pt is from out of state. Labs and medications reviewed.   No nutrition interventions warranted at this time. If nutrition issues arise, please consult RD.     Trenton Gammon, MS, RD, LDN, [facility] Inpatient Clinical Dietitian Pager # [phone] After hours/weekend pager # [phone]

## 2017-06-10 NOTE — Progress Notes (Addendum)
PHARMACIST - PHYSICIAN COMMUNICATION DR: Sunnie Nielsen CONCERNING: Antibiotic IV to Oral Route Change Policy  RECOMMENDATION: This patient is receiving Ciprofloxacin by the intravenous route.  Based on criteria approved by the Pharmacy and Therapeutics Committee, the antibiotic(s) is/are being converted to the equivalent oral dose form(s).   DESCRIPTION: These criteria include:  Patient being treated for a respiratory tract infection, urinary tract infection, cellulitis or clostridium difficile associated diarrhea if on metronidazole  The patient is not neutropenic and does not exhibit a GI malabsorption state  The patient is eating (either orally or via tube) and/or has been taking other orally administered medications for a least 24 hours  The patient is improving clinically and has a Tmax < 100.5  If you have questions about this conversion, please contact the Pharmacy Department  []   [phone] )  [facility] []   [phone] )  [facility] []   [phone] )  [facility] []   [phone] )  [facility] [x]   [phone] )  [facility]

## 2017-06-10 NOTE — Progress Notes (Signed)
CSW consulted for SNF placement.CSW met with pt / spouse to assist with d/c planning. Pt reports that she plans to stay with family at dc. Pt is in agreement with [facility] if covered by insurance ( states she has only medicare part A and BCBS FED covered under spouse ). Pt is hoping she will be able to still catch her flight back home to [place] on Thursday.   Werner Lean LCSW [phone]

## 2017-06-10 NOTE — Progress Notes (Signed)
Spoke with patient at bedside. She is here visiting from [place]. She plans to return to [place] post op, she has a flight on 8/23. She is unsure at this time of DME needs, she has a walker at home, she thinks she can borrow one to use here. She plans to f/u for therapy once she returns home. Will continue to follow and update as needed.

## 2017-06-10 NOTE — Progress Notes (Signed)
Physical Therapy Treatment Patient Details Name: Melinda Montgomery MRN: [id number] DOB: 09/20/1941 Today's Date: 06/10/2017    History of Present Illness 76 yo female vsiting family, missed step. 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 ENCOURAGED TO LIMIT WEIGHT on right leg. Plans DC to family then flying to [place]. Does have 2 other family and spouse who will be with patient on flight.  Follow Up Recommendations  Home health PT;Supervision/Assistance - 24 hour     Equipment Recommendations  Rolling walker with 5" wheels    Recommendations for Other Services       Precautions / Restrictions Precautions Precautions: Fall Precaution Comments: OK for ROM  right knee Required Braces or Orthoses: Knee Immobilizer - Right Knee Immobilizer - Left: On when out of bed or walking Restrictions RLE Weight Bearing: Touchdown weight bearing Other Position/Activity Restrictions: MD down graded to TDWB today    Mobility  Bed Mobility Overal bed mobility: Needs Assistance Bed Mobility: Supine to Sit;Sit to Supine     Supine to sit: Min guard Sit to supine: Min guard   General bed mobility comments: pt self assisted the right leg  Transfers Overall transfer level: Needs assistance Equipment used: Rolling walker (2 wheeled) Transfers: Sit to/from Stand Sit to Stand: Min guard         General transfer comment: cues for hand placement, steadying assist  Ambulation/Gait Ambulation/Gait assistance: Min guard Ambulation Distance (Feet): 20 Feet (x 2) Assistive device: Rolling walker (2 wheeled) Gait Pattern/deviations: Step-to pattern Gait velocity: decreased   General Gait Details: cues for sequence and TDWB     Stairs            Wheelchair Mobility    Modified Rankin (Stroke Patients Only)       Balance                                            Cognition Arousal/Alertness: Awake/alert                                            Exercises Total Joint Exercises Ankle Circles/Pumps: AROM;10 reps;Supine Quad Sets: AROM;10 reps;Supine Heel Slides: AAROM;Right;10 reps;Supine Hip ABduction/ADduction: AAROM;Right;10 reps;Supine Straight Leg Raises: AAROM;Right;10 reps;Supine    General Comments        Pertinent Vitals/Pain Pain Score: 1  Pain Location: R LE Pain Descriptors / Indicators: Sore Pain Intervention(s): Premedicated before session;Ice applied    Home Living                      Prior Function            PT Goals (current goals can now be found in the care plan section) Acute Rehab PT Goals Patient Stated Goal: to return home to [facility] on Thursday PT Goal Formulation: With patient/family Time For Goal Achievement: 06/17/17 Progress towards PT goals: Progressing toward goals    Frequency    Min 6X/week      PT Plan      Co-evaluation PT/OT/SLP Co-Evaluation/Treatment:  Yes Reason for Co-Treatment: For patient/therapist safety PT goals addressed during session: Mobility/safety with mobility        AM-PAC PT "6 Clicks" Daily Activity  Outcome Measure  Difficulty turning over in bed (including adjusting bedclothes, sheets and blankets)?: A Little Difficulty moving from lying on back to sitting on the side of the bed? : A Little Difficulty sitting down on and standing up from a chair with arms (e.g., wheelchair, bedside commode, etc,.)?: A Little Help needed moving to and from a bed to chair (including a wheelchair)?: A Lot Help needed walking in hospital room?: A Lot Help needed climbing 3-5 steps with a railing? : A Lot 6 Click Score: 15    End of Session Equipment Utilized During Treatment: Gait  belt;Right knee immobilizer Activity Tolerance: Patient tolerated treatment well Patient left: in chair;with call bell/phone within reach;with chair alarm set;with family/visitor present Nurse Communication: Mobility status PT Visit Diagnosis: Difficulty in walking, not elsewhere classified (R26.2);Pain Pain - Right/Left: Right Pain - part of body: Knee     Time: 1421-1447 PT Time Calculation (min) (ACUTE ONLY): 26 min  Charges:  $Gait Training: 8-22 mins $Therapeutic Exercise: 8-22 mins                    G CodesBlanchard Kelch PT [phone]  here}   Rada Hay 06/10/2017, 3:06 PM

## 2017-06-10 NOTE — Evaluation (Addendum)
Occupational Therapy Evaluation Patient Details Name: Melinda Montgomery MRN: [id number] DOB: 01-Nov-1940 Today's Date: 06/10/2017    History of Present Illness 76 yo female vsiting family, missed step. S/P Right femoral nail with proximal distal interlock, retrograde for closed distal femur shaft fracture above total knee arthroplasty,,Biomet retrograde femoral nail with proximal distal interlocks  nail on 06/09/17, H/O R reverse total shoulder   Clinical Impression   Pt was independent prior to admission. Presents with moderate R LE pain and decreased standing balance. Pt has a supportive husband who will assist with LB bathing and dressing as needed. Pt plans to return to her sister in law's home locally and stay on the first floor. Will follow acutely.  Follow Up Recommendations  No OT follow up    Equipment Recommendations   (RW)    Recommendations for Other Services       Precautions / Restrictions Precautions Precautions: Fall Required Braces or Orthoses: Knee Immobilizer - Left Knee Immobilizer - Left: Other (comment) (no order, but KI maintained) Restrictions Weight Bearing Restrictions: Yes RLE Weight Bearing: order changed to TDWB from WBAT after visit completed      Mobility Bed Mobility               General bed mobility comments: pt seated at EOB upon arrival  Transfers Overall transfer level: Needs assistance Equipment used: Rolling walker (2 wheeled) Transfers: Sit to/from Stand Sit to Stand: Min assist         General transfer comment: cues for hand placement, steadying assist    Balance Overall balance assessment: Needs assistance   Sitting balance-Leahy Scale: Good       Standing balance-Leahy Scale: Fair Standing balance comment: able to release walker to perform pericare                           ADL either performed or assessed with clinical judgement   ADL Overall ADL's : Needs assistance/impaired Eating/Feeding:  Independent;Sitting   Grooming: Set up;Sitting   Upper Body Bathing: Set up;Sitting   Lower Body Bathing: Minimal assistance;Sit to/from stand   Upper Body Dressing : Set up;Sitting   Lower Body Dressing: Minimal assistance;Sit to/from stand   Toilet Transfer: Minimal assistance;Ambulation;BSC;RW   Toileting- Clothing Manipulation and Hygiene: Sit to/from stand;Minimal assistance       Functional mobility during ADLs: Minimal assistance;Rolling walker General ADL Comments: Husband will assist with LB bathing and dressing.     Vision Baseline Vision/History: Wears glasses Wears Glasses: Reading only Patient Visual Report: No change from baseline       Perception     Praxis      Pertinent Vitals/Pain Pain Assessment: 0-10 Pain Score: 5  Pain Location: R LE Pain Descriptors / Indicators: Sore;Grimacing;Guarding Pain Intervention(s): Monitored during session;Premedicated before session;Repositioned;Ice applied     Hand Dominance Right   Extremity/Trunk Assessment Upper Extremity Assessment Upper Extremity Assessment: Overall WFL for tasks assessed (hx of L TSA)   Lower Extremity Assessment Lower Extremity Assessment: Defer to PT evaluation       Communication Communication Communication: No difficulties   Cognition Arousal/Alertness: Awake/alert Behavior During Therapy: WFL for tasks assessed/performed Overall Cognitive Status: Within Functional Limits for tasks assessed                                     General Comments  Exercises     Shoulder Instructions      Home Living Family/patient expects to be discharged to:: Private residence Living Arrangements: Spouse/significant other Available Help at Discharge: Family;Available 24 hours/day Type of Home: House Home Access: Stairs to enter [employer] of Steps: 4 Entrance Stairs-Rails: Right Home Layout: Two level;Able to live on main level with bedroom/bathroom      Bathroom Shower/Tub: Chief Strategy Officer: Handicapped height Bathroom Accessibility: No       Additional Comments: walker and shower seat at home in [place]      Prior Functioning/Environment Level of Independence: Independent                 OT Problem List: Impaired balance (sitting and/or standing)      OT Treatment/Interventions:   See Education   OT Goals(Current goals can be found in the care plan section) Acute Rehab OT Goals Patient Stated Goal: to return home to [facility] on Thursday  OT Frequency:  2x/week   Barriers to D/C:            Co-evaluation PT/OT/SLP Co-Evaluation/Treatment: Yes Reason for Co-Treatment: For patient/therapist safety   OT goals addressed during session: ADL's and self-care      AM-PAC PT "6 Clicks" Daily Activity     Outcome Measure Help from another person eating meals?: None Help from another person taking care of personal grooming?: A Little Help from another person toileting, which includes using toliet, bedpan, or urinal?: A Little Help from another person bathing (including washing, rinsing, drying)?: A Little Help from another person to put on and taking off regular upper body clothing?: None Help from another person to put on and taking off regular lower body clothing?: A Little 6 Click Score: 20   End of Session Equipment Utilized During Treatment: Gait belt;Rolling walker Nurse Communication: Mobility status  Activity Tolerance: Patient tolerated treatment well Patient left: in chair;with call bell/phone within reach;with chair alarm set;with family/visitor present  OT Visit Diagnosis: Unsteadiness on feet (R26.81);Muscle weakness (generalized) (M62.81);Pain Pain - Right/Left: Right Pain - part of body: Leg                Time: 1610-9604 OT Time Calculation (min): 30 min Charges:  OT General Charges $OT Visit: 1 Procedure OT Evaluation $OT Eval Moderate Complexity: 1 Procedure G-Codes:      07-06-2017 Martie Round, OTR/L Pager: [phone] [place] Planas, [place] Bailiff Jul 06, 2017, 11:10 AM

## 2017-06-10 NOTE — Progress Notes (Addendum)
   Subjective: 1 Day Post-Op Procedure(s) (LRB): INTRAMEDULLARY (IM) RETROGRADE FEMORAL NAILING (Right) Patient reports pain as mild.    Objective: Vital signs in last 24 hours: Temp:  [87.6 F (30.9 C)-98.7 F (37.1 C)] 98.2 F (36.8 C) (08/20 1046) Pulse Rate:  [71-77] 71 (08/20 1046) Resp:  [11-18] 17 (08/20 1046) BP: (103-141)/(45-74) 115/52 (08/20 1046) SpO2:  [89 %-99 %] 96 % (08/20 1046)  Intake/Output from previous day: 08/19 0701 - 08/20 0700 In: 4502.1 [P.O.:840; I.V.:3262.5; IV Piggyback:399.6] Out: 3955 [Urine:3855; Blood:100] Intake/Output this shift: Total I/O In: 240 [P.O.:240] Out: 200 [Urine:200]   Recent Labs  06/08/17 2145 06/09/17 1844 06/10/17 0520  HGB 12.7 12.7 10.5*    Recent Labs  06/09/17 1844 06/10/17 0520  WBC 13.8* 10.2  RBC 4.20 3.58*  HCT 38.7 32.2*  PLT 207 185    Recent Labs  06/08/17 2145 06/09/17 1844 06/10/17 0520  NA 140  --  137  K 4.4  --  3.8  CL 105  --  107  CO2 26  --  22  BUN 21*  --  13  CREATININE 0.91 0.85 0.72  GLUCOSE 137*  --  112*  CALCIUM 9.0  --  8.2*    Recent Labs  06/08/17 2145  INR 0.89    Neurologically intact Dg C-arm 61-120 Min-no Report  Result Date: 06/09/2017 CLINICAL DATA:  Repair of right femoral fracture. EXAM: DG C-ARM 61-120 MIN-NO REPORT; RIGHT FEMUR 2 VIEWS FLUOROSCOPY TIME:  2 minutes and 1 second. Images: 5 COMPARISON:  None. FINDINGS: A rod has been placed across the distal femoral fracture with improved alignment. IMPRESSION: Right femoral fracture repair as above. Electronically Signed   By: Gerome Sam III M.D   On: 06/09/2017 14:33   Dg Femur, Min 2 Views Right  Result Date: 06/09/2017 CLINICAL DATA:  Repair of right femoral fracture. EXAM: DG C-ARM 61-120 MIN-NO REPORT; RIGHT FEMUR 2 VIEWS FLUOROSCOPY TIME:  2 minutes and 1 second. Images: 5 COMPARISON:  None. FINDINGS: A rod has been placed across the distal femoral fracture with improved alignment. IMPRESSION:  Right femoral fracture repair as above. Electronically Signed   By: Gerome Sam III M.D   On: 06/09/2017 14:33    Assessment/Plan: 1 Day Post-Op Procedure(s) (LRB): INTRAMEDULLARY (IM) RETROGRADE FEMORAL NAILING (Right) Up with therapy. Will be TDWB. followup with her Orthopedic surgeon in [place]. Knee ROM with PT. Staples out at 2 wks.  Will place on Xarelto for 2 wks with planned airplane trip , pharmacy consult for education and pharmacy can stop lovenox.  Eldred Manges 06/10/2017, 11:24 AM

## 2017-06-10 NOTE — Discharge Instructions (Addendum)

## 2017-06-11 LAB — CBC
HCT: 32.2 % — ABNORMAL LOW (ref 36.0–46.0)
HEMOGLOBIN: 10.7 g/dL — AB (ref 12.0–15.0)
MCH: 30.6 pg (ref 26.0–34.0)
MCHC: 33.2 g/dL (ref 30.0–36.0)
MCV: 92 fL (ref 78.0–100.0)
PLATELETS: 198 10*3/uL (ref 150–400)
RBC: 3.5 MIL/uL — AB (ref 3.87–5.11)
RDW: 14.6 % (ref 11.5–15.5)
WBC: 9.2 10*3/uL (ref 4.0–10.5)

## 2017-06-11 LAB — BASIC METABOLIC PANEL
ANION GAP: 8 (ref 5–15)
BUN: 18 mg/dL (ref 6–20)
CO2: 23 mmol/L (ref 22–32)
Calcium: 8.1 mg/dL — ABNORMAL LOW (ref 8.9–10.3)
Chloride: 105 mmol/L (ref 101–111)
Creatinine, Ser: 0.82 mg/dL (ref 0.44–1.00)
GFR calc Af Amer: >60 mL/min (ref >60)
Glucose, Bld: 108 mg/dL — ABNORMAL HIGH (ref 65–99)
POTASSIUM: 4 mmol/L (ref 3.5–5.1)
SODIUM: 136 mmol/L (ref 135–145)

## 2017-06-11 MED ORDER — POLYETHYLENE GLYCOL 3350 17 G PO PACK: 17.0000 g | PACK | Freq: Every day | ORAL | 0 refills | Status: AC

## 2017-06-11 MED ORDER — CIPROFLOXACIN HCL 500 MG PO TABS
500.0000 mg | ORAL_TABLET | Freq: Two times a day (BID) | ORAL | 0 refills | Status: AC
Start: 2017-06-11 — End: ?

## 2017-06-11 MED ORDER — RIVAROXABAN 10 MG PO TABS: 10.0000 mg | ORAL_TABLET | Freq: Every day | ORAL | 0 refills | Status: AC

## 2017-06-11 MED ORDER — BISACODYL 5 MG PO TBEC: 5.0000 mg | DELAYED_RELEASE_TABLET | Freq: Every day | ORAL | 0 refills | Status: AC | PRN

## 2017-06-11 NOTE — Progress Notes (Signed)
Physical Therapy Treatment Patient Details Name: Melinda Montgomery MRN: [id number] DOB: 1941-10-13 Today's Date: 06/11/2017    History of Present Illness 76 yo female vsiting family, missed step. S/P Right femoral nail with proximal distal interlock, retrograde for closed distal femur shaft fracture above total knee arthroplasty,,Biomet retrograde femoral nail with proximal distal interlocks  nail on 06/09/17, H/O L reverse total shoulder    PT Comments    POD # 2 am session Instructed on on TTWB while "flat footed" during gait for increased balance/safety.  Tolerated a limited distance due to UE weakness and fatigue.  Returned to room then performed some TKR TE's followed by ICE.  Pt given handout HEP.  Pt will need another session to address stairs with family present.     Follow Up Recommendations  Home health PT;Supervision/Assistance - 24 hour     Equipment Recommendations   (pt plans to borrow a walker)    Recommendations for Other Services       Precautions / Restrictions Precautions Precaution Comments: TDWB Restrictions Weight Bearing Restrictions: Yes RLE Weight Bearing: Touchdown weight bearing Other Position/Activity Restrictions: MD down graded to TDWB 06/10/2017    Mobility  Bed Mobility               General bed mobility comments: OOB in recliner  Transfers Overall transfer level: Needs assistance Equipment used: Rolling walker (2 wheeled) Transfers: Sit to/from Stand Sit to Stand: Min guard         General transfer comment: for safety. Cues for UE/LE placement  Ambulation/Gait Ambulation/Gait assistance: Min guard Ambulation Distance (Feet): 18 Feet Assistive device: Rolling walker (2 wheeled)   Gait velocity: decreased   General Gait Details: cues for sequence and TDWB    Stairs            Wheelchair Mobility    Modified Rankin (Stroke Patients Only)       Balance                                             Cognition Arousal/Alertness: Awake/alert Behavior During Therapy: WFL for tasks assessed/performed Overall Cognitive Status: Within Functional Limits for tasks assessed                                        Exercises   Total Knee Replacement TE's 10 reps B LE ankle pumps 10 reps towel squeezes 10 reps knee presses 10 reps heel slides  10 reps SAQ's 10 reps SLR's 10 reps ABD Followed by ICE     General Comments        Pertinent Vitals/Pain Pain Assessment: 0-10 Pain Score: 6  Pain Location: R knee Pain Descriptors / Indicators: Operative site guarding;Grimacing;Sore Pain Intervention(s): Monitored during session;Repositioned;Ice applied    Home Living                      Prior Function            PT Goals (current goals can now be found in the care plan section) Progress towards PT goals: Progressing toward goals    Frequency    Min 6X/week      PT Plan      Co-evaluation  AM-PAC PT "6 Clicks" Daily Activity  Outcome Measure  Difficulty turning over in bed (including adjusting bedclothes, sheets and blankets)?: A Little Difficulty moving from lying on back to sitting on the side of the bed? : A Little Difficulty sitting down on and standing up from a chair with arms (e.g., wheelchair, bedside commode, etc,.)?: A Little Help needed moving to and from a bed to chair (including a wheelchair)?: A Lot Help needed walking in hospital room?: A Lot Help needed climbing 3-5 steps with a railing? : A Lot 6 Click Score: 15    End of Session Equipment Utilized During Treatment: Gait belt Activity Tolerance: Patient tolerated treatment well Patient left: in chair;with call bell/phone within reach;with chair alarm set;with family/visitor present Nurse Communication: Mobility status PT Visit Diagnosis: Difficulty in walking, not elsewhere classified (R26.2);Pain Pain - Right/Left: Right Pain - part of body: Knee      Time: 1610-9604 PT Time Calculation (min) (ACUTE ONLY): 28 min  Charges:  $Gait Training: 8-22 mins $Therapeutic Exercise: 8-22 mins                    G Codes:       Felecia Shelling  PTA [facility]  Acute  Rehab Pager      [phone]

## 2017-06-11 NOTE — Discharge Summary (Signed)
Physician Discharge Summary  Melinda Montgomery YQM:[id number] DOB: 1941-05-20 DOA: 06/08/2017  PCP: System, Pcp Not In  Admit date: 06/08/2017 Discharge date: 06/11/2017  Admitted From: Home  Disposition:  Home   Recommendations for Outpatient Follow-up:  1. Follow up with PCP in 1-2 weeks 2. Please obtain BMP/CBC in one week 3. Needs to follow up with orthopedic.  4.   Home Health: yes.   Discharge Condition: stable.  CODE STATUS: Full code.  Diet recommendation: Heart Healthy   Brief/Interim Summary:   Millsapsis a 76 y.o.femalewith medical history significant for hypertension and hypothyroidism, now presenting to the emergency department for evaluation of severe right knee pain and inability to bear weight after mechanical fall. Patient is here from her home in [place], visiting a friend, and has been experiencing some urinary urgency and frequency, but has been generally well with no fevers or chills, no cough or dyspnea, and no chest pain or palpitations. She reports feeling well today when she tripped and fell onto her side and experienced immediate and severe pain at the right knee. She denies hitting her head or losing consciousness. She was unable to bear weight after this. EMS was called out for evaluation. She was transported to the hospital. Patient is able to attend to all of her ADLs and reports an active lifestyle, never experiencing chest pain or palpitations, and with ability to ascend a flight of stairs without any significant respiratory symptoms.  [facility] ED Course:Upon arrival to the [facility] ED, patient is found to be afebrile, saturating well on room air, and with vitals otherwise stable. EKG features a sinus rhythm with low-voltage QRS. Chest x-ray demonstrated mild bronchitic changes and radiographs of the right femur demonstrate acute displaced and slightly angulated distal femur fracture. Chemistry panels notable for mildly elevated BUN to creatinine  ratio. CBC is unremarkable. Urinalysis is suggestive of possible infection. Patient was treated with morphine and Zofran in the emergency department. Orthopedic surgery was consulted by the ED physician and recommended a medical admission, indicating that they would plan to operate on the patient on 06/09/2017. Patient remained medically stable and in no apparent respiratory distress and will be admitted to the medical-surgical unit at [facility] for ongoing evaluation and management of distal right femur fracture after mechanical fall.  Assessment & Plan:   Active Problems:   Fracture of distal femur (HCC)   Essential hypertension   Hypothyroidism   Closed displaced fracture of lower epiphysis of right femur (HCC)   Acute lower UTI   Fall   Distal right femur fracture;  Underwent Intramedullary Retrograde Femoral nailing.   Started on xarelto for DVT prophylaxis.  Bowel regimen.  Patient tolerating diet, had BM.  Plan to discharge on xarelto.  Patient is planing to return to [place] at discharge   HTN; Continue with inderal.  Hold norvasc, Olmersartan at discharge   Hypothyroidism;  Continue with synthroid.   UTI;  Cipro BID Follow urine culture. Pending.  She will be discharge on cipro for 3 more days.   Discharge Diagnoses:  Active Problems:   Fracture of distal femur (HCC)   Essential hypertension   Hypothyroidism   Closed displaced fracture of lower epiphysis of right femur (HCC)   Acute lower UTI   Fall    Discharge Instructions  Discharge Instructions    Diet - low sodium heart healthy    Complete by:  As directed    Increase activity slowly    Complete by:  As directed      Allergies as of 06/11/2017      Reactions   Asa [aspirin]    Stomach burning    Penicillins Other (See Comments)   Unknown reaction  Has patient had a PCN reaction causing immediate rash, facial/tongue/throat swelling, SOB or lightheadedness with hypotension:  n/a Has patient had a PCN reaction causing severe rash involving mucus membranes or skin necrosis: n/a Has patient had a PCN reaction that required hospitalization: n/a Has patient had a PCN reaction occurring within the last 10 years: n/a If all of the above answers are "NO", then may proceed with Cephalosporin use.      Medication List    STOP taking these medications   amLODipine-olmesartan 5-20 MG tablet Commonly known as:  AZOR   celecoxib 200 MG capsule Commonly known as:  CELEBREX     TAKE these medications   atorvastatin 20 MG tablet Commonly known as:  LIPITOR Take 20 mg by mouth every evening.   bisacodyl 5 MG EC tablet Commonly known as:  DULCOLAX Take 1 tablet (5 mg total) by mouth daily as needed for moderate constipation.   cholecalciferol 400 units Tabs tablet Commonly known as:  VITAMIN D Take 400 Units by mouth 2 (two) times daily.   ciprofloxacin 500 MG tablet Commonly known as:  CIPRO Take 1 tablet (500 mg total) by mouth 2 (two) times daily.   HYDROcodone-acetaminophen 5-325 MG tablet Commonly known as:  NORCO Take 1-2 tablets by mouth every 6 (six) hours as needed for moderate pain.   levothyroxine 150 MCG tablet Commonly known as:  SYNTHROID, LEVOTHROID Take 150 mcg by mouth daily before breakfast.   omeprazole 20 MG capsule Commonly known as:  PRILOSEC Take 20 mg by mouth daily as needed (reflux).   polyethylene glycol packet Commonly known as:  MIRALAX / GLYCOLAX Take 17 g by mouth daily.   propranolol ER 60 MG 24 hr capsule Commonly known as:  INDERAL LA Take 60 mg by mouth daily after breakfast.   RHOPRESSA 0.02 % Soln Generic drug:  Netarsudil Dimesylate Place 1 drop into both eyes at bedtime.   rivaroxaban 10 MG Tabs tablet Commonly known as:  XARELTO Take 1 tablet (10 mg total) by mouth daily.   Timolol Maleate 0.5 % (DAILY) Soln Place 1 drop into both eyes daily after breakfast.   Trospium Chloride 60 MG Cp24 Take 60 mg  by mouth daily.   vitamin C 500 MG tablet Commonly known as:  ASCORBIC ACID Take 500 mg by mouth daily.       Allergies  Allergen Reactions  . Asa [Aspirin]     Stomach burning   . Penicillins Other (See Comments)    Unknown reaction  Has patient had a PCN reaction causing immediate rash, facial/tongue/throat swelling, SOB or lightheadedness with hypotension: n/a Has patient had a PCN reaction causing severe rash involving mucus membranes or skin necrosis: n/a Has patient had a PCN reaction that required hospitalization: n/a Has patient had a PCN reaction occurring within the last 10 years: n/a If all of the above answers are "NO", then may proceed with Cephalosporin use.     Consultations:  Dr Ophelia Charter    Procedures/Studies: Dg Chest Portable 1 View  Result Date: 06/08/2017 CLINICAL DATA:  Larey Seat this evening, femur fracture. EXAM: PORTABLE CHEST 1 VIEW COMPARISON:  None. FINDINGS: Cardiomediastinal silhouette is normal. Mild bronchitic changes without pleural effusion or focal consolidation. No pneumothorax. LEFT shoulder arthroplasty. Soft tissue planes are  nonsuspicious. IMPRESSION: Mild bronchitic changes. Electronically Signed   By: Awilda Metro M.D.   On: 06/08/2017 22:45   Dg Knee Complete 4 Views Right  Result Date: 06/08/2017 CLINICAL DATA:  Larey Seat on right knee EXAM: RIGHT KNEE - COMPLETE 4+ VIEW COMPARISON:  None. FINDINGS: Status post right knee replacement. Acute fracture of the distal femur above the prosthesis. This demonstrates varus angulation of the distal femoral fracture fragment and lower leg. There is about 1/2 shaft diameter of medial and posterior displacement of the distal fracture fragments. IMPRESSION: Status post right knee replacement with acute displaced and slightly angulated distal femoral fracture about a cm above the femoral compliment of the knee replacement Electronically Signed   By: Jasmine Pang M.D.   On: 06/08/2017 21:31   Dg C-arm 61-120  Min-no Report  Result Date: 06/09/2017 CLINICAL DATA:  Repair of right femoral fracture. EXAM: DG C-ARM 61-120 MIN-NO REPORT; RIGHT FEMUR 2 VIEWS FLUOROSCOPY TIME:  2 minutes and 1 second. Images: 5 COMPARISON:  None. FINDINGS: A rod has been placed across the distal femoral fracture with improved alignment. IMPRESSION: Right femoral fracture repair as above. Electronically Signed   By: Gerome Sam III M.D   On: 06/09/2017 14:33   Dg Femur, Min 2 Views Right  Result Date: 06/09/2017 CLINICAL DATA:  Repair of right femoral fracture. EXAM: DG C-ARM 61-120 MIN-NO REPORT; RIGHT FEMUR 2 VIEWS FLUOROSCOPY TIME:  2 minutes and 1 second. Images: 5 COMPARISON:  None. FINDINGS: A rod has been placed across the distal femoral fracture with improved alignment. IMPRESSION: Right femoral fracture repair as above. Electronically Signed   By: Gerome Sam III M.D   On: 06/09/2017 14:33    (Echo, Carotid, EGD, Colonoscopy, ERCP)    Subjective:   Discharge Exam: Vitals:   06/11/17 0532 06/11/17 0706  BP: (!) 118/54   Pulse: 86   Resp: 17   Temp: 98.7 F (37.1 C)   SpO2: 90% 93%   Vitals:   06/10/17 2027 06/11/17 0416 06/11/17 0532 06/11/17 0706  BP: (!) 120/48  (!) 118/54   Pulse: 71  86   Resp: 18 16 17    Temp: 98 F (36.7 C)  98.7 F (37.1 C)   TempSrc: Oral  Oral   SpO2: 95%  90% 93%  Weight:      Height:        General: Pt is alert, awake, not in acute distress Cardiovascular: RRR, S1/S2 +, no rubs, no gallops Respiratory: CTA bilaterally, no wheezing, no rhonchi Abdominal: Soft, NT, ND, bowel sounds + Extremities: no edema, no cyanosis    The results of significant diagnostics from this hospitalization (including imaging, microbiology, ancillary and laboratory) are listed below for reference.     Microbiology: Recent Results (from the past 240 hour(s))  Surgical pcr screen     Status: None   Collection Time: 06/09/17  2:00 AM  Result Value Ref Range Status   MRSA, PCR  NEGATIVE NEGATIVE Final   Staphylococcus aureus NEGATIVE NEGATIVE Final    Comment:        The Xpert SA Assay (FDA approved for NASAL specimens in patients over 77 years of age), is one component of a comprehensive surveillance program.  Test performance has been validated by [facility] for patients greater than or equal to 10 year old. It is not intended to diagnose infection nor to guide or monitor treatment.      Labs: BNP (last 3 results) No results for input(s): BNP  in the last 8760 hours. Basic Metabolic Panel:  Recent Labs Lab 06/08/17 2145 06/09/17 1844 06/10/17 0520 06/11/17 0740  NA 140  --  137 136  K 4.4  --  3.8 4.0  CL 105  --  107 105  CO2 26  --  22 23  GLUCOSE 137*  --  112* 108*  BUN 21*  --  13 18  CREATININE 0.91 0.85 0.72 0.82  CALCIUM 9.0  --  8.2* 8.1*   Liver Function Tests: No results for input(s): AST, ALT, ALKPHOS, BILITOT, PROT, ALBUMIN in the last 168 hours. No results for input(s): LIPASE, AMYLASE in the last 168 hours. No results for input(s): AMMONIA in the last 168 hours. CBC:  Recent Labs Lab 06/08/17 2145 06/09/17 1844 06/10/17 0520 06/11/17 0740  WBC 7.3 13.8* 10.2 9.2  NEUTROABS 5.0  --   --   --   HGB 12.7 12.7 10.5* 10.7*  HCT 39.1 38.7 32.2* 32.2*  MCV 92.2 92.1 89.9 92.0  PLT 213 207 185 198   Cardiac Enzymes: No results for input(s): CKTOTAL, CKMB, CKMBINDEX, TROPONINI in the last 168 hours. BNP: Invalid input(s): POCBNP CBG: No results for input(s): GLUCAP in the last 168 hours. D-Dimer No results for input(s): DDIMER in the last 72 hours. Hgb A1c No results for input(s): HGBA1C in the last 72 hours. Lipid Profile No results for input(s): CHOL, HDL, LDLCALC, TRIG, CHOLHDL, LDLDIRECT in the last 72 hours. Thyroid function studies No results for input(s): TSH, T4TOTAL, T3FREE, THYROIDAB in the last 72 hours.  Invalid input(s): FREET3 Anemia work up No results for input(s): VITAMINB12, FOLATE, FERRITIN,  TIBC, IRON, RETICCTPCT in the last 72 hours. Urinalysis    Component Value Date/Time   COLORURINE YELLOW 06/08/2017 2230   APPEARANCEUR CLEAR 06/08/2017 2230   LABSPEC 1.010 06/08/2017 2230   PHURINE 7.5 06/08/2017 2230   GLUCOSEU NEGATIVE 06/08/2017 2230   HGBUR NEGATIVE 06/08/2017 2230   BILIRUBINUR NEGATIVE 06/08/2017 2230   KETONESUR NEGATIVE 06/08/2017 2230   PROTEINUR NEGATIVE 06/08/2017 2230   NITRITE NEGATIVE 06/08/2017 2230   LEUKOCYTESUR MODERATE (A) 06/08/2017 2230   Sepsis Labs Invalid input(s): PROCALCITONIN,  WBC,  LACTICIDVEN Microbiology Recent Results (from the past 240 hour(s))  Surgical pcr screen     Status: None   Collection Time: 06/09/17  2:00 AM  Result Value Ref Range Status   MRSA, PCR NEGATIVE NEGATIVE Final   Staphylococcus aureus NEGATIVE NEGATIVE Final    Comment:        The Xpert SA Assay (FDA approved for NASAL specimens in patients over 36 years of age), is one component of a comprehensive surveillance program.  Test performance has been validated by [facility] for patients greater than or equal to 25 year old. It is not intended to diagnose infection nor to guide or monitor treatment.      Time coordinating discharge: Over 30 minutes  SIGNED:   Alba Cory, MD  [facility] 06/11/2017, 11:09 AM Pager   If 7PM-7AM, please contact night-coverage [email or web address] Password [email or web address]

## 2017-06-11 NOTE — Progress Notes (Signed)
Physical Therapy Treatment Patient Details Name: Melinda Montgomery MRN: [id number] DOB: September 30, 1941 Today's Date: 06/11/2017    History of Present Illness 76 yo female vsiting family, missed step. S/P Right femoral nail with proximal distal interlock, retrograde for closed distal femur shaft fracture above total knee arthroplasty,,Biomet retrograde femoral nail with proximal distal interlocks  nail on 06/09/17, H/O L reverse total shoulder    PT Comments    POD # 2 pm session Spouse and family present, practiced stairs "hands on" up backward with walker due to TTWB.  Practiced twice and addressed all PT mobility questions.  Pt ready for D/C to home.   Follow Up Recommendations  Home health PT;Supervision/Assistance - 24 hour     Equipment Recommendations   (pt plans to borrow a walker)    Recommendations for Other Services       Precautions / Restrictions Precautions Precaution Comments: TDWB Restrictions Weight Bearing Restrictions: Yes RLE Weight Bearing: Touchdown weight bearing Other Position/Activity Restrictions: MD down graded to TDWB 06/10/2017    Mobility  Bed Mobility               General bed mobility comments: OOB in recliner  Transfers Overall transfer level: Needs assistance Equipment used: Rolling walker (2 wheeled) Transfers: Sit to/from Stand Sit to Stand: Min guard         General transfer comment: for safety. Cues for UE/LE placement  Ambulation/Gait Ambulation/Gait assistance: Min guard Ambulation Distance (Feet): 18 Feet Assistive device: Rolling walker (2 wheeled)   Gait velocity: decreased   General Gait Details: cues for sequence and TDWB    Stairs Stairs: Yes   Stair Management: No rails;Step to pattern;Backwards;With walker Number of Stairs: 4 General stair comments: with spouse and other family members present, practiced stairs "hands on" up backward with walker due to TTWB  Wheelchair Mobility    Modified Rankin (Stroke  Patients Only)       Balance                                            Cognition Arousal/Alertness: Awake/alert Behavior During Therapy: WFL for tasks assessed/performed Overall Cognitive Status: Within Functional Limits for tasks assessed                                        Exercises      General Comments        Pertinent Vitals/Pain Pain Assessment: 0-10 Pain Score: 6  Pain Location: R knee Pain Descriptors / Indicators: Operative site guarding;Grimacing;Sore Pain Intervention(s): Monitored during session;Repositioned;Ice applied    Home Living                      Prior Function            PT Goals (current goals can now be found in the care plan section) Progress towards PT goals: Progressing toward goals    Frequency    Min 6X/week      PT Plan      Co-evaluation              AM-PAC PT "6 Clicks" Daily Activity  Outcome Measure  Difficulty turning over in bed (including adjusting bedclothes, sheets and blankets)?: A Little Difficulty moving from lying on back to sitting on  the side of the bed? : A Little Difficulty sitting down on and standing up from a chair with arms (e.g., wheelchair, bedside commode, etc,.)?: A Little Help needed moving to and from a bed to chair (including a wheelchair)?: A Lot Help needed walking in hospital room?: A Lot Help needed climbing 3-5 steps with a railing? : A Lot 6 Click Score: 15    End of Session Equipment Utilized During Treatment: Gait belt Activity Tolerance: Patient tolerated treatment well Patient left: in chair;with call bell/phone within reach;with chair alarm set;with family/visitor present Nurse Communication: Mobility status PT Visit Diagnosis: Difficulty in walking, not elsewhere classified (R26.2);Pain Pain - Right/Left: Right Pain - part of body: Knee     Time: 3875-6433 PT Time Calculation (min) (ACUTE ONLY): 24 min  Charges:  $Gait  Training: 8-22 mins $Therapeutic Activity: 8-22 mins                    G Codes:       Felecia Shelling  PTA [facility]  Acute  Rehab Pager      [phone]

## 2017-06-11 NOTE — Progress Notes (Signed)
Occupational Therapy Treatment Patient Details Name: Melinda Montgomery MRN: [id number] DOB: 06/15/1941 Today's Date: 06/11/2017    History of present illness 76 yo female vsiting family, missed step. S/P Right femoral nail with proximal distal interlock, retrograde for closed distal femur shaft fracture above total knee arthroplasty,,Biomet retrograde femoral nail with proximal distal interlocks  nail on 06/09/17, H/O L reverse total shoulder   OT comments  Pt maintaining TDWB.  Plans flight on Thursday  Follow Up Recommendations  Supervision/Assistance - 24 hour    Equipment Recommendations  None recommended by OT (has a RW at home and high toilet)    Recommendations for Other Services      Precautions / Restrictions Precautions Precautions: Fall Precaution Comments: TDWB Required Braces or Orthoses: Knee Immobilizer - Right Knee Immobilizer - Left: On when out of bed or walking Restrictions RLE Weight Bearing: Touchdown weight bearing       Mobility Bed Mobility         Supine to sit: Min assist Sit to supine: Min assist   General bed mobility comments: for RLE from flat bed  Transfers   Equipment used: Rolling walker (2 wheeled)   Sit to Stand: Min guard         General transfer comment: for safety. Cues for UE/LE placement    Balance                                           ADL either performed or assessed with clinical judgement   ADL       Grooming: Oral care;Min guard;Standing                   Toilet Transfer: Min guard;Minimal assistance;Ambulation;Comfort height toilet;RW   Toileting- Clothing Manipulation and Hygiene: Minimal assistance;Sit to/from stand         General ADL Comments: demonstrated shower transfer maintaining TDWB.  Pt able to get onto higher toilet with min guard but light min A needed to get off. House she is staying at has sink (did not simulate this time) and hers is a Acupuncturist.  Pt plans  to fly home on a non-stop plane from [facility] Thursday.  She will have curbside w/c.  Reviewed toilet transfer using 2 rails.  Most difficult thing will be getting into airplane seat. Showed scooting over if end armrest will lift vs holding onto seat. She will not be able to access toilet while on plane:  she may want to consider a depends type of garment for protection.  Educated to move her foot often     Sport and exercise psychologist Arousal/Alertness: Awake/alert Behavior During Therapy: WFL for tasks assessed/performed Overall Cognitive Status: Within Functional Limits for tasks assessed                                          Exercises     Shoulder Instructions       General Comments      Pertinent Vitals/ Pain       Pain Assessment: Faces Faces Pain Scale: Hurts a little bit Pain Location: R LE Pain Descriptors / Indicators: Sore Pain Intervention(s): Limited activity within patient's tolerance;Monitored during session;Premedicated before session;Repositioned;Ice  applied  Home Living                                          Prior Functioning/Environment              Frequency  Min 2X/week        Progress Toward Goals  OT Goals(current goals can now be found in the care plan section)  Progress towards OT goals: Progressing toward goals  Acute Rehab OT Goals Patient Stated Goal: to return home to [place] on Thursday  Plan      Co-evaluation                 AM-PAC PT "6 Clicks" Daily Activity     Outcome Measure   Help from another person eating meals?: None Help from another person taking care of personal grooming?: A Little Help from another person toileting, which includes using toliet, bedpan, or urinal?: A Little Help from another person bathing (including washing, rinsing, drying)?: A Little Help from another person to put on and taking off regular upper body clothing?: A  Little Help from another person to put on and taking off regular lower body clothing?: A Little 6 Click Score: 19    End of Session    Pain - Right/Left: Right Pain - part of body: Leg   Activity Tolerance Patient tolerated treatment well   Patient Left in bed;with call bell/phone within reach   Nurse Communication          Time: 4193-7902 OT Time Calculation (min): 27 min  Charges: OT General Charges $OT Visit: 1 Procedure OT Treatments $Self Care/Home Management : 23-37 mins  Marica Otter, OTR/L [phone] 06/11/2017   Taimi Towe 06/11/2017, 8:47 AM

## 2018-08-09 IMAGING — DX DG CHEST 1V PORT
1 series · 1 of 1 positions shown · non-contrast
Comparison: None.

CLINICAL DATA: Fell this evening, femur fracture.

EXAM:
PORTABLE CHEST 1 VIEW

[chest ap]
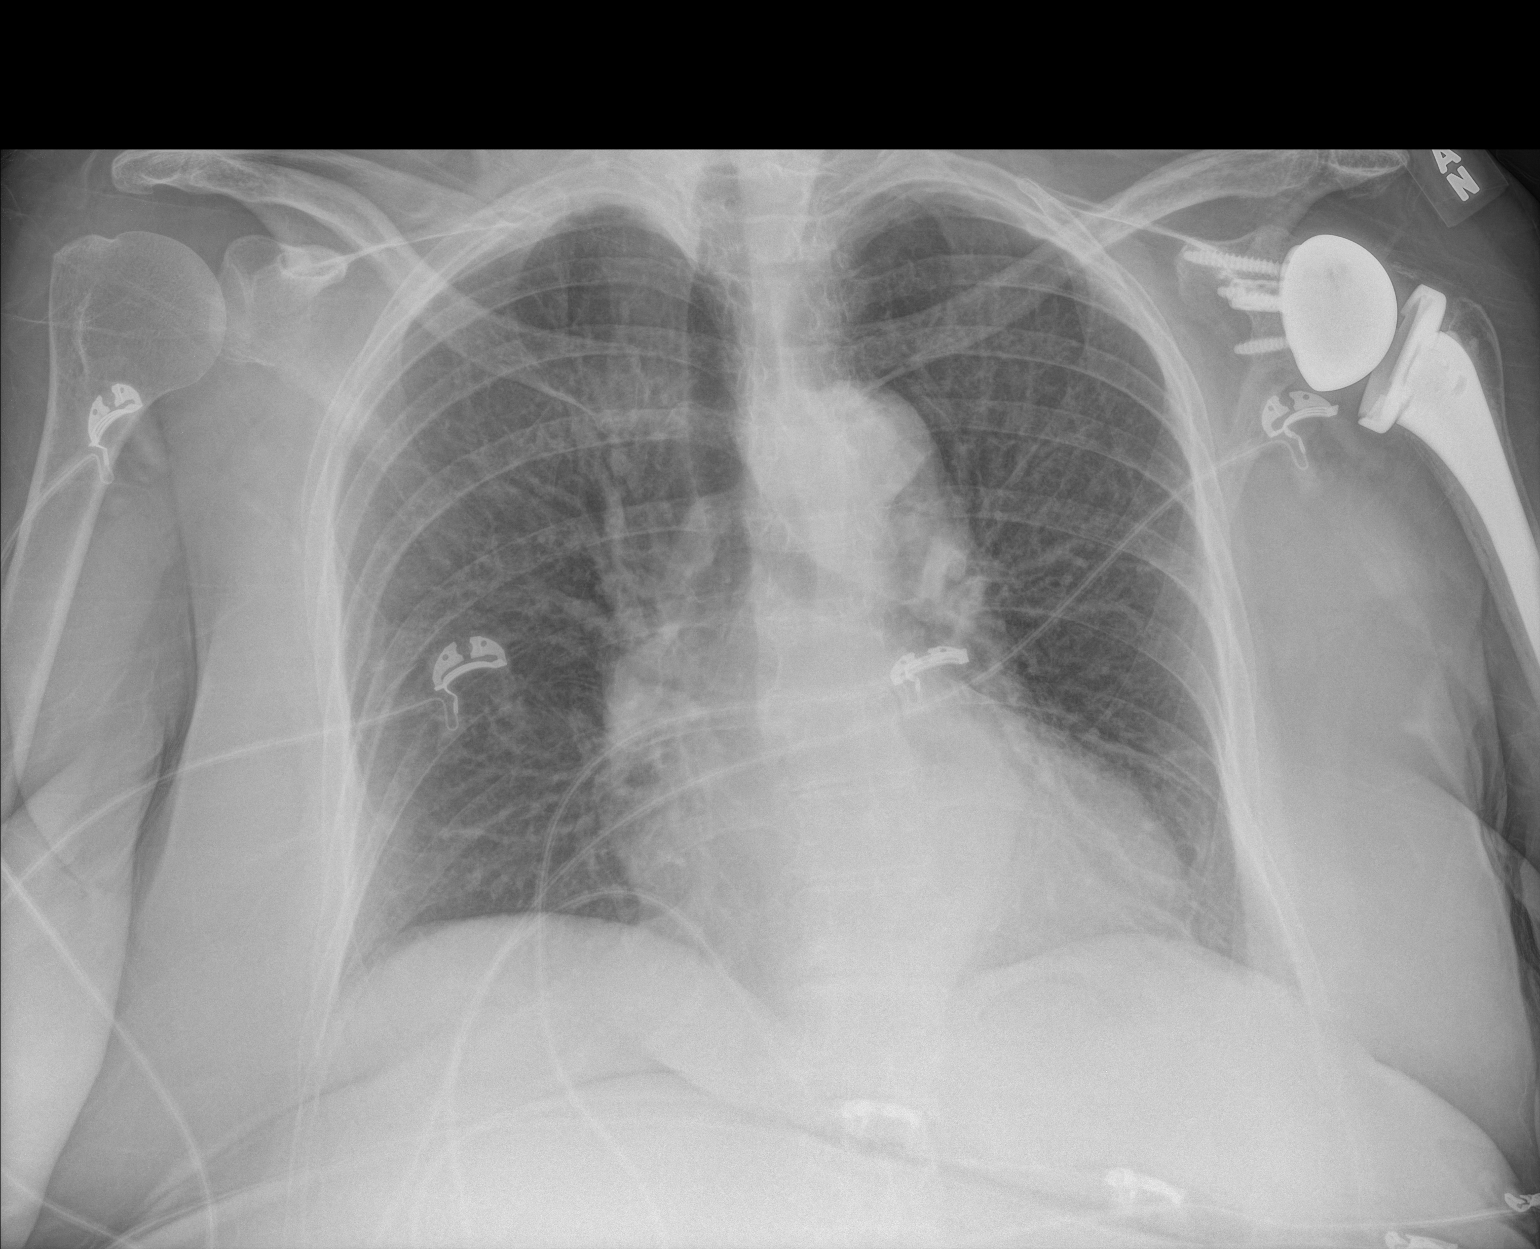

[1 of 1 positions shown; findings below may reference images not displayed]

FINDINGS: Cardiomediastinal silhouette is normal. Mild bronchitic changes
without pleural effusion or focal consolidation. No pneumothorax.
LEFT shoulder arthroplasty. Soft tissue planes are nonsuspicious.
IMPRESSION: Mild bronchitic changes.

## 2018-08-09 IMAGING — DX DG KNEE COMPLETE 4+V*R*
4 series · 4 of 4 positions shown · non-contrast
Comparison: None.

CLINICAL DATA: Fell on right knee

EXAM:
RIGHT KNEE - COMPLETE 4+ VIEW

[knee ap]
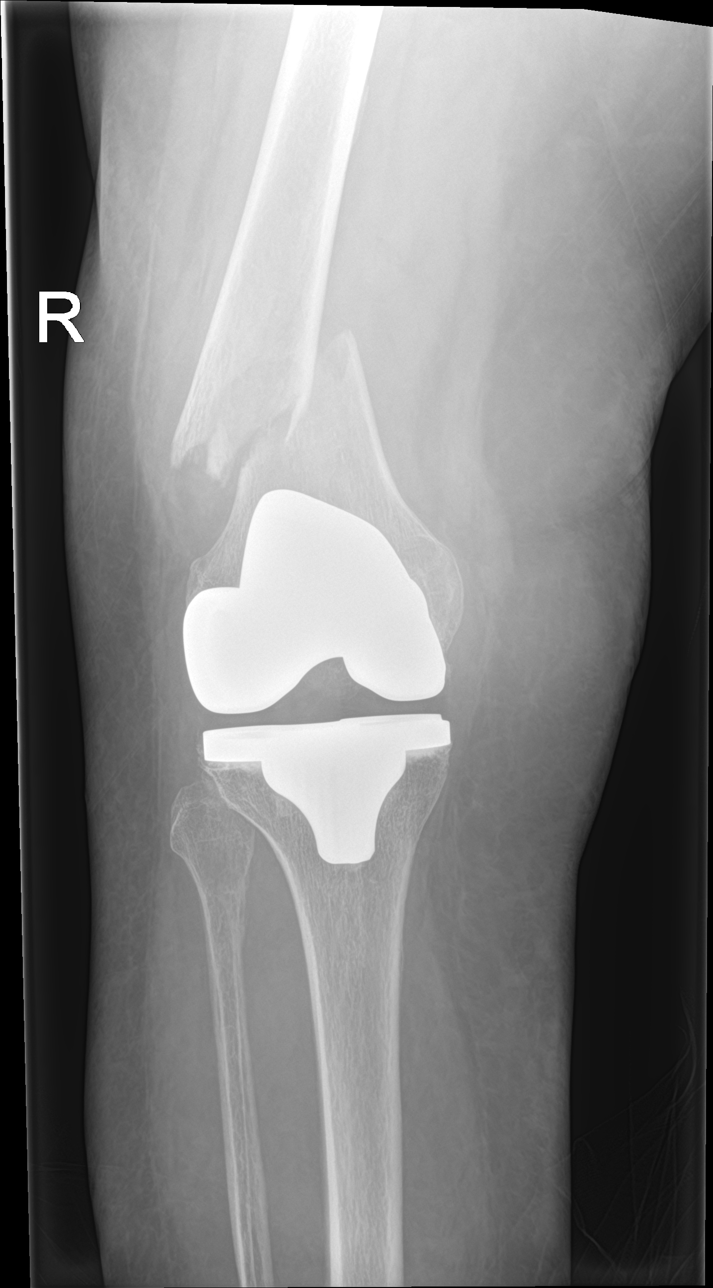

[knee lat]
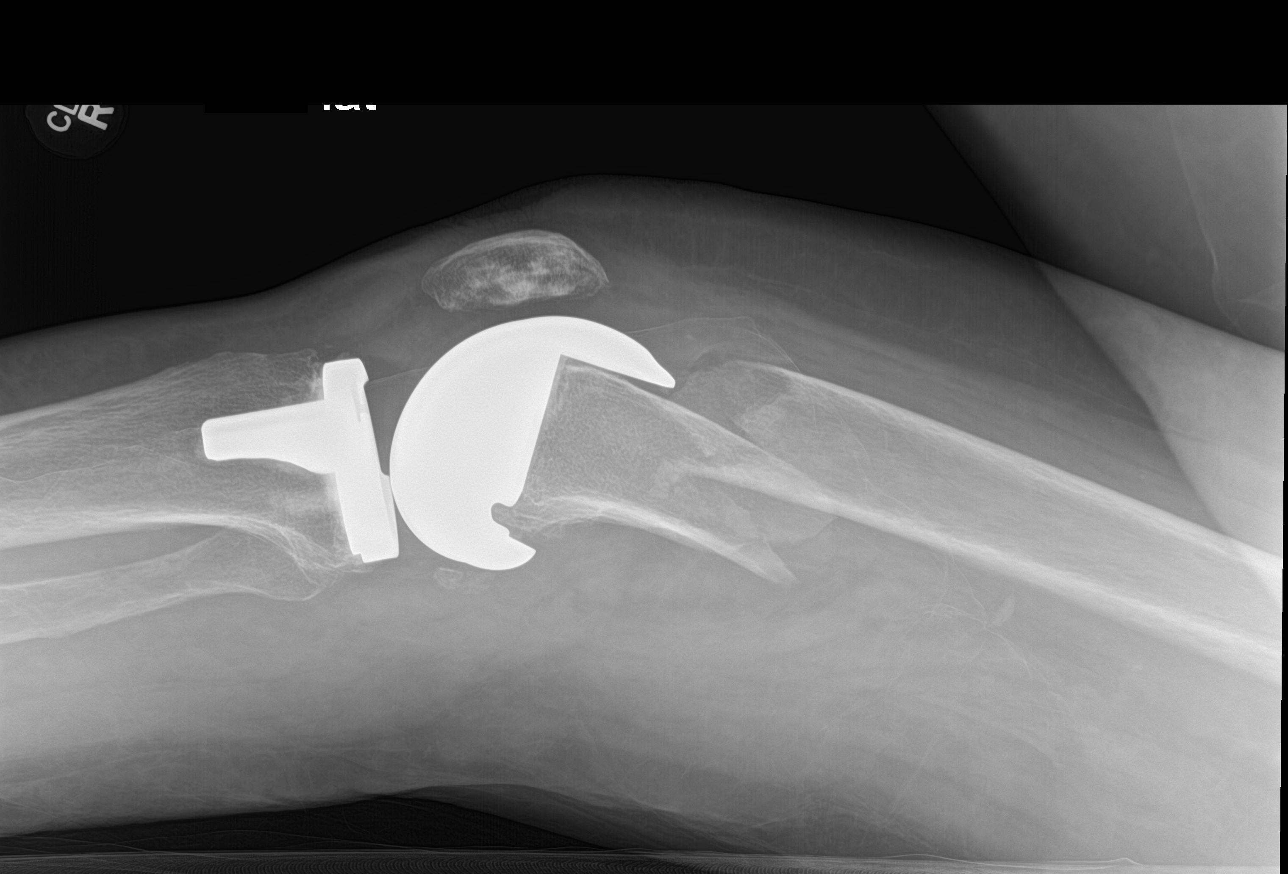

[knee obl (1 of 2)]
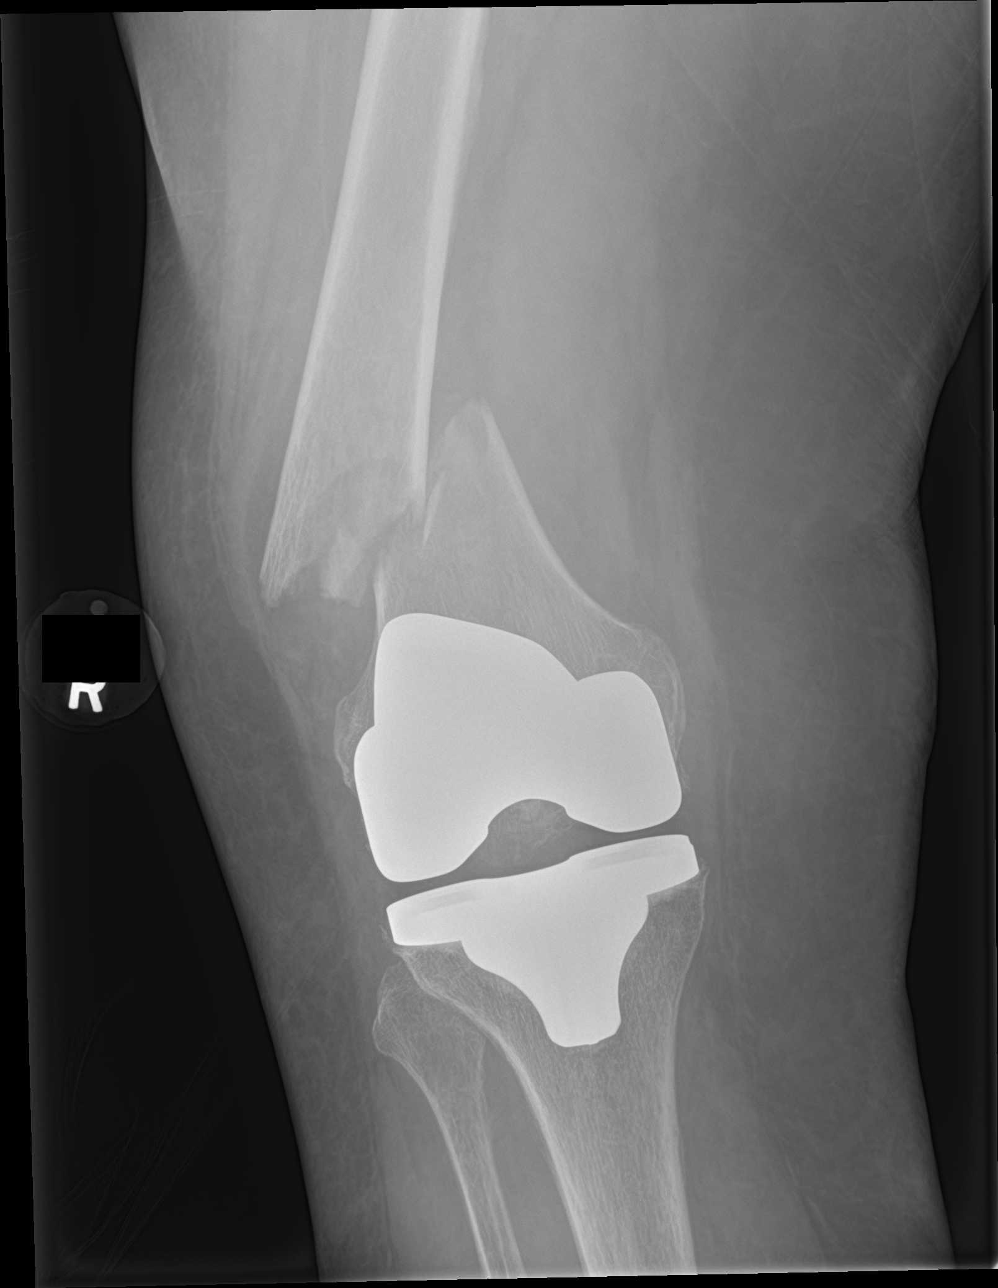

[knee obl (2 of 2)]
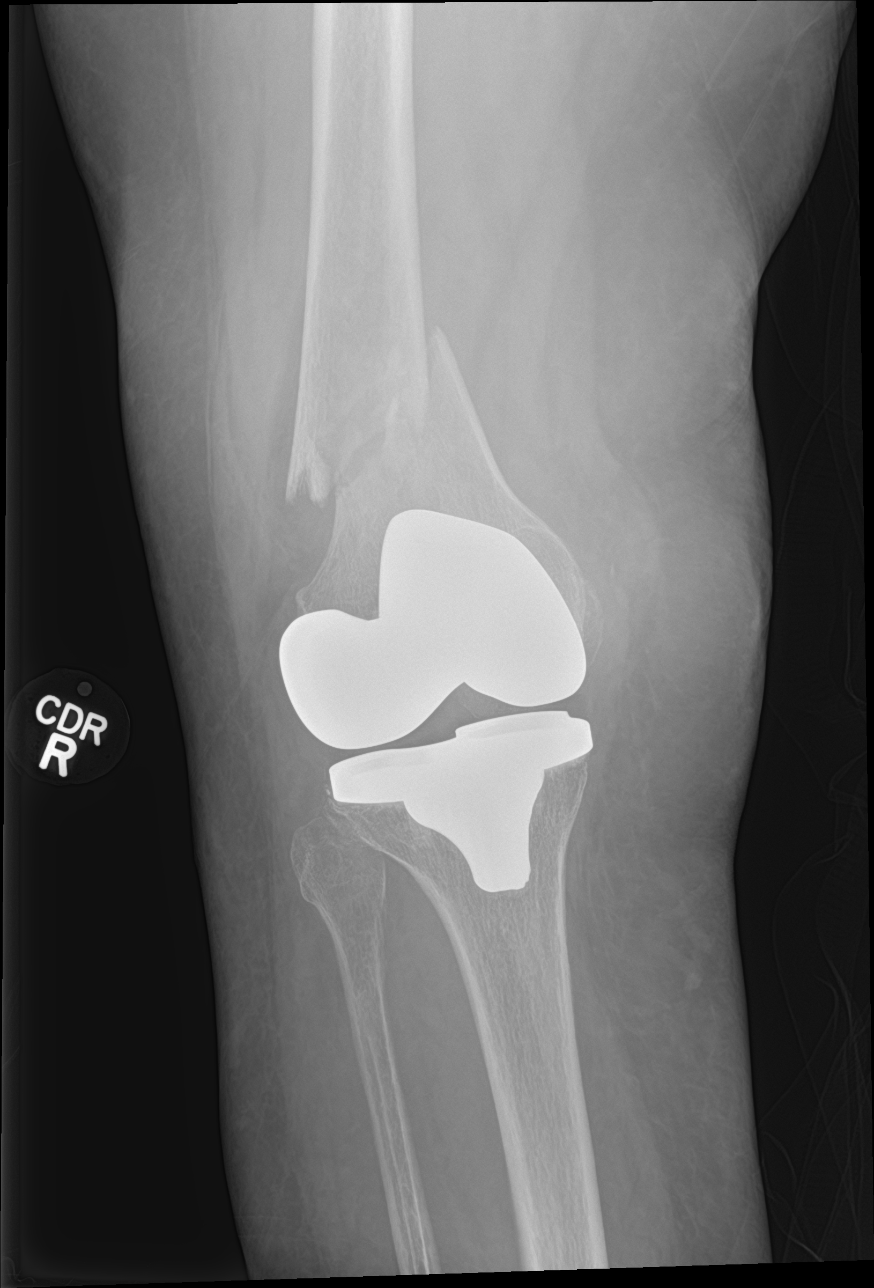

[4 of 4 positions shown; findings below may reference images not displayed]

FINDINGS: Status post right knee replacement. Acute fracture of the distal
femur above the prosthesis. This demonstrates varus angulation of
the distal femoral fracture fragment and lower leg. There is about
[DATE] shaft diameter of medial and posterior displacement of the
distal fracture fragments.
IMPRESSION: Status post right knee replacement with acute displaced and slightly
angulated distal femoral fracture about a cm above the femoral
compliment of the knee replacement
